# Patient Record
Sex: Male | Born: 1945 | Race: White | Hispanic: No | Marital: Married | Smoking: Never smoker
Health system: Southern US, Community
[De-identification: ages and names within clinical notes are randomized; demographics above are authoritative.]

## PROBLEM LIST (undated history)

## (undated) DIAGNOSIS — Z8719 Personal history of other diseases of the digestive system: Secondary | ICD-10-CM

## (undated) DIAGNOSIS — G709 Myoneural disorder, unspecified: Secondary | ICD-10-CM

## (undated) DIAGNOSIS — K219 Gastro-esophageal reflux disease without esophagitis: Secondary | ICD-10-CM

## (undated) DIAGNOSIS — M1990 Unspecified osteoarthritis, unspecified site: Secondary | ICD-10-CM

## (undated) DIAGNOSIS — I10 Essential (primary) hypertension: Secondary | ICD-10-CM

## (undated) HISTORY — PX: COLONOSCOPY: SHX174

---

## 2014-12-12 ENCOUNTER — Other Ambulatory Visit: Payer: Self-pay | Admitting: Nurse Practitioner

## 2014-12-12 ENCOUNTER — Ambulatory Visit
Admission: RE | Admit: 2014-12-12 | Discharge: 2014-12-12 | Disposition: A | Discharge status: Home / Self Care | Payer: Medicare Other | Source: Ambulatory Visit | Attending: Nurse Practitioner | Admitting: Nurse Practitioner

## 2014-12-12 DIAGNOSIS — R109 Unspecified abdominal pain: Secondary | ICD-10-CM

## 2014-12-29 ENCOUNTER — Other Ambulatory Visit: Payer: Self-pay | Admitting: Internal Medicine

## 2014-12-29 ENCOUNTER — Ambulatory Visit
Admission: RE | Admit: 2014-12-29 | Discharge: 2014-12-29 | Disposition: A | Discharge status: Home / Self Care | Payer: Medicare Other | Source: Ambulatory Visit | Attending: Internal Medicine | Admitting: Internal Medicine

## 2014-12-29 DIAGNOSIS — R042 Hemoptysis: Secondary | ICD-10-CM

## 2015-12-09 ENCOUNTER — Other Ambulatory Visit: Payer: Self-pay | Admitting: Internal Medicine

## 2015-12-09 ENCOUNTER — Ambulatory Visit
Admission: RE | Admit: 2015-12-09 | Discharge: 2015-12-09 | Disposition: A | Discharge status: Home / Self Care | Payer: Medicare Other | Source: Ambulatory Visit | Attending: Internal Medicine | Admitting: Internal Medicine

## 2015-12-09 DIAGNOSIS — R1011 Right upper quadrant pain: Secondary | ICD-10-CM | POA: Diagnosis not present

## 2015-12-09 DIAGNOSIS — R079 Chest pain, unspecified: Secondary | ICD-10-CM

## 2015-12-09 DIAGNOSIS — R05 Cough: Secondary | ICD-10-CM | POA: Diagnosis not present

## 2015-12-18 ENCOUNTER — Ambulatory Visit
Admission: RE | Admit: 2015-12-18 | Discharge: 2015-12-18 | Disposition: A | Discharge status: Home / Self Care | Payer: Medicare Other | Source: Ambulatory Visit | Attending: Internal Medicine | Admitting: Internal Medicine

## 2015-12-18 DIAGNOSIS — R1011 Right upper quadrant pain: Secondary | ICD-10-CM

## 2015-12-18 DIAGNOSIS — K7689 Other specified diseases of liver: Secondary | ICD-10-CM | POA: Diagnosis not present

## 2015-12-23 ENCOUNTER — Ambulatory Visit: Payer: Self-pay | Admitting: General Surgery

## 2015-12-23 DIAGNOSIS — K828 Other specified diseases of gallbladder: Secondary | ICD-10-CM | POA: Diagnosis not present

## 2015-12-23 NOTE — H&P (Signed)
History of Present Illness Harry Mckinney; 12/23/2015 9:37 AM) The patient is a 70 year old male who presents for evaluation of gall stones. The patient is a 70 year old male who is referred by Dr. 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 Patient states she's had pain in the right upper quadrant for approximately a month. He states that he has since changed his diet and is on a bland diet. Patient states that the pain is a right upper quadrant transverse sharp. There is no associated emesis however some nausea. Patient is an ultrasound which revealed a "porcelain gallbladder."      Other Problems Harry Mckinney, CMA; 12/23/2015 9:13 AM) Chest pain Gastroesophageal Reflux Disease High blood pressure  Past Surgical History Harry Mckinney, CMA; 12/23/2015 9:13 AM) No pertinent past surgical history  Diagnostic Studies History Harry Mckinney, CMA; 12/23/2015 9:13 AM) Colonoscopy 1-5 years ago  Allergies Harry Mckinney, CMA; 12/23/2015 9:13 AM) No Known Drug Allergies02/22/2017  Medication History Harry Mckinney, CMA; 12/23/2015 9:14 AM) AmLODIPine Besylate (  Tablet, Oral) Active. ALPRAZolam (  Tablet, Oral) Active. Lisinopril (  Tablet, Oral) Active. Medications Reconciled  Social History Harry Mckinney, [place]; 12/23/2015 9:13 AM) Alcohol use Occasional alcohol use. No caffeine use No drug use Tobacco use Never smoker.  Family History Harry Mckinney, [place]; 12/23/2015 9:13 AM) Diabetes Mellitus Father. Heart Disease Brother.    Review of Systems Harry Mckinney; 12/23/2015 9:35 AM) General Not Present- Appetite Loss, Chills, Fatigue, Fever, Night Sweats, Weight Gain and Weight Loss. Skin Not Present- Change in Wart/Mole, Dryness, Hives, Jaundice, New Lesions, Non-Healing Wounds, Rash and Ulcer. HEENT Not Present- Earache, Hearing Loss, Hoarseness, Nose Bleed, Oral Ulcers, Ringing in the Ears, Seasonal Allergies, Sinus Pain, Sore Throat, Visual  Disturbances, Wears glasses/contact lenses and Yellow Eyes. Respiratory Present- Snoring. Not Present- Bloody sputum, Chronic Cough, Difficulty Breathing and Wheezing. Cardiovascular Present- Chest Pain. Not Present- Difficulty Breathing Lying Down, Leg Cramps, Palpitations, Rapid Heart Rate, Shortness of Breath and Swelling of Extremities. Gastrointestinal Present- Abdominal Pain and Excessive gas. Not Present- Bloating, Bloody Stool, Change in Bowel Habits, Chronic diarrhea, Constipation, Difficulty Swallowing, Gets full quickly at meals, Hemorrhoids, Indigestion, Nausea, Rectal Pain and Vomiting. Male Genitourinary Not Present- Frequency, Nocturia, Painful Urination, Pelvic Pain and Urgency. Musculoskeletal Not Present- Back Pain, Joint Pain, Joint Stiffness, Muscle Pain, Muscle Weakness and Swelling of Extremities. Neurological Not Present- Weakness. Psychiatric Not Present- Anxiety, Bipolar, Change in Sleep Pattern, Depression, Fearful and Frequent crying. Endocrine Not Present- Cold Intolerance, Excessive Hunger, Hair Changes, Heat Intolerance, Hot flashes and New Diabetes.  Vitals Harry Mckinney CMA; 12/23/2015 9:14 AM) 12/23/2015 9:14 AM Weight: 197.2 lb Height: 71in Body Surface Area: 2.1 m Body Mass Index: 27.5 kg/m  Temp.: 97.58F(Temporal)  Pulse: 75 (Regular)  BP: 148/84 (Sitting, Left Arm, Standard)       Physical Exam Harry Filler, Mckinney; 12/23/2015 9:35 AM) General Mental Status-Alert. General Appearance-Consistent with stated age. Hydration-Well hydrated. Voice-Normal.  Head and Neck Head-normocephalic, atraumatic with no lesions or palpable masses.  Eye Eyeball - Bilateral-Extraocular movements intact. Sclera/Conjunctiva - Bilateral-No scleral icterus.  Chest and Lung Exam Chest and lung exam reveals -quiet, even and easy respiratory effort with no use of accessory muscles. Inspection Chest Wall - Normal. Back -  normal.  Cardiovascular Cardiovascular examination reveals -normal heart sounds, regular rate and rhythm with no murmurs.  Abdomen Inspection Normal Exam - No Hernias. Palpation/Percussion Normal exam - Soft, Non Tender, No Rebound tenderness, No Rigidity (guarding) and No hepatosplenomegaly. Auscultation Normal exam - Bowel sounds  normal.  Neurologic Neurologic evaluation reveals -alert and oriented x 3 with no impairment of recent or remote memory. Mental Status-Normal.  Musculoskeletal Normal Exam - Left-Upper Extremity Strength Normal and Lower Extremity Strength Normal. Normal Exam - Right-Upper Extremity Strength Normal, Lower Extremity Weakness.    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 Patient also appears to have symptoms from this.  1. We will proceed to the operating room for a laparoscopic cholecystectomy 2. Risks and benefits were discussed with the patient to generally include, but not limited to: infection, bleeding, possible need for post op ERCP, damage to the bile ducts, bile leak, and possible need for further surgery. Alternatives were offered and described. All questions were answered and the patient voiced understanding of the procedure and wishes to proceed at this point with a laparoscopic cholecystectomy

## 2015-12-30 ENCOUNTER — Other Ambulatory Visit (HOSPITAL_COMMUNITY): Payer: Self-pay | Admitting: *Deleted

## 2015-12-31 ENCOUNTER — Encounter (HOSPITAL_COMMUNITY): Payer: Self-pay

## 2015-12-31 ENCOUNTER — Encounter (HOSPITAL_COMMUNITY)
Admission: RE | Admit: 2015-12-31 | Discharge: 2015-12-31 | Disposition: A | Discharge status: Home / Self Care | Payer: Medicare Other | Source: Ambulatory Visit | Attending: General Surgery | Admitting: General Surgery

## 2015-12-31 DIAGNOSIS — K828 Other specified diseases of gallbladder: Secondary | ICD-10-CM | POA: Insufficient documentation

## 2015-12-31 DIAGNOSIS — Z01812 Encounter for preprocedural laboratory examination: Secondary | ICD-10-CM | POA: Insufficient documentation

## 2015-12-31 HISTORY — DX: Myoneural disorder, unspecified: G70.9

## 2015-12-31 HISTORY — DX: Unspecified osteoarthritis, unspecified site: M19.90

## 2015-12-31 HISTORY — DX: Personal history of other diseases of the digestive system: Z87.19

## 2015-12-31 HISTORY — DX: Essential (primary) hypertension: I10

## 2015-12-31 HISTORY — DX: Gastro-esophageal reflux disease without esophagitis: K21.9

## 2015-12-31 LAB — CBC
HEMATOCRIT: 44.5 % (ref 39.0–52.0)
Hemoglobin: 15.4 g/dL (ref 13.0–17.0)
MCH: 31.9 pg (ref 26.0–34.0)
MCHC: 34.6 g/dL (ref 30.0–36.0)
MCV: 92.1 fL (ref 78.0–100.0)
PLATELETS: DECREASED 10*3/uL (ref 150–400)
RBC: 4.83 MIL/uL (ref 4.22–5.81)
RDW: 11.6 % (ref 11.5–15.5)
WBC: 5 10*3/uL (ref 4.0–10.5)

## 2015-12-31 LAB — BASIC METABOLIC PANEL
Anion gap: 9 (ref 5–15)
BUN: 17 mg/dL (ref 6–20)
CO2: 27 mmol/L (ref 22–32)
CREATININE: 1.02 mg/dL (ref 0.61–1.24)
Calcium: 9.1 mg/dL (ref 8.9–10.3)
Chloride: 106 mmol/L (ref 101–111)
Glucose, Bld: 100 mg/dL — ABNORMAL HIGH (ref 65–99)
POTASSIUM: 4.4 mmol/L (ref 3.5–5.1)
SODIUM: 142 mmol/L (ref 135–145)

## 2015-12-31 NOTE — Progress Notes (Signed)
Call to [facility], challenge given for PCN allergy, in view of Cefazolin ordered for preop antibiotic. Spoke with [employer].  Also sent fax for records fr. Dr. Roseanne Reno.  Pt. Denies ever having any cardiac advancded testing.

## 2015-12-31 NOTE — Progress Notes (Signed)
Call back from [facility], Bernie, antibiotic will be changed to Cipro.

## 2015-12-31 NOTE — Pre-Procedure Instructions (Signed)
Harry Mckinney  12/31/2015      [id number] - [place], [place] - [address] [place] [address] Phone: [phone] Fax: [phone]    Your procedure is scheduled on 01/12/2016.  Report to [facility] Admitting at 7:00 A.M.  Call this number if you have problems the morning of surgery:  [phone]   Remember:  Do not eat food or drink liquids after midnight.  On Monday   Take these medicines the morning of surgery with A SIP OF WATER : amlodipine, ( tylenol, Zantac is  Ok if needed)   Do not wear jewelry   Do not wear lotions, powders, or perfumes.  You may wear deodorant.              Men may shave face and neck.   Do not bring valuables to the hospital.   [facility] is not responsible for any belongings or valuables.  Contacts, dentures or bridgework may not be worn into surgery.  Leave your suitcase in the car.  After surgery it may be brought to your room.  For patients admitted to the hospital, discharge time will be determined by your treatment team.  Patients discharged the day of surgery will not be allowed to drive home.   Name and phone number of your driver:   With spouse   Special instructions:  Special Instructions:  - Preparing for Surgery  Before surgery, you can play an important role.  Because skin is not sterile, your skin needs to be as free of germs as possible.  You can reduce the number of germs on you skin by washing with CHG (chlorahexidine gluconate) soap before surgery.  CHG is an antiseptic cleaner which kills germs and bonds with the skin to continue killing germs even after washing.  Please DO NOT use if you have an allergy to CHG or antibacterial soaps.  If your skin becomes reddened/irritated stop using the CHG and inform your nurse when you arrive at Short Stay.  Do not shave (including legs and underarms) for at least 48 hours prior to the first CHG shower.  You  may shave your face.  Please follow these instructions carefully:   1.  Shower with CHG Soap the night before surgery and the  morning of Surgery.  2.  If you choose to wash your hair, wash your hair first as usual with your  normal shampoo.  3.  After you shampoo, rinse your hair and body thoroughly to remove the  Shampoo.  4.  Use CHG as you would any other liquid soap.  You can apply chg directly to the skin and wash gently with scrungie or a clean washcloth.  5.  Apply the CHG Soap to your body ONLY FROM THE NECK DOWN.    Do not use on open wounds or open sores.  Avoid contact with your eyes, ears, mouth and genitals (private parts).  Wash genitals (private parts)   with your normal soap.  6.  Wash thoroughly, paying special attention to the area where your surgery will be performed.  7.  Thoroughly rinse your body with warm water from the neck down.  8.  DO NOT shower/wash with your normal soap after using and rinsing off   the CHG Soap.  9.  Pat yourself dry with a clean towel.            10.  Wear  clean pajamas.            11.  Place clean sheets on your bed the night of your first shower and do not sleep with pets.  Day of Surgery  Do not apply any lotions/deodorants the morning of surgery.  Please wear clean clothes to the hospital/surgery center.   Please read over the following fact sheets that you were given. Pain Booklet, Coughing and Deep Breathing and Surgical Site Infection Prevention

## 2015-12-31 NOTE — Progress Notes (Signed)
Pt. Unsure of name of antibiotic, but several yrs. Ago, he had extreme GI upset with an antibiotic that he was rec'ing for sinus problem. Pt. States it was Rx through [facility] & he was urged to call the office & explore what the antibiotic is to add to his list. )Pt. Counseled on how probiotic works & how it might be helpful to him.

## 2016-01-12 ENCOUNTER — Encounter (HOSPITAL_COMMUNITY): Payer: Self-pay | Admitting: *Deleted

## 2016-01-12 ENCOUNTER — Ambulatory Visit (HOSPITAL_COMMUNITY): Payer: Medicare Other | Admitting: Anesthesiology

## 2016-01-12 ENCOUNTER — Encounter (HOSPITAL_COMMUNITY)
Admission: RE | Disposition: A | Discharge status: Home / Self Care | Payer: Self-pay | Source: Ambulatory Visit | Attending: General Surgery

## 2016-01-12 ENCOUNTER — Ambulatory Visit (HOSPITAL_COMMUNITY)
Admission: RE | Admit: 2016-01-12 | Discharge: 2016-01-12 | Disposition: A | Discharge status: Home / Self Care | Payer: Medicare Other | Source: Ambulatory Visit | Attending: General Surgery | Admitting: General Surgery

## 2016-01-12 DIAGNOSIS — K219 Gastro-esophageal reflux disease without esophagitis: Secondary | ICD-10-CM | POA: Diagnosis not present

## 2016-01-12 DIAGNOSIS — K8020 Calculus of gallbladder without cholecystitis without obstruction: Secondary | ICD-10-CM | POA: Diagnosis not present

## 2016-01-12 DIAGNOSIS — K811 Chronic cholecystitis: Secondary | ICD-10-CM | POA: Insufficient documentation

## 2016-01-12 DIAGNOSIS — I10 Essential (primary) hypertension: Secondary | ICD-10-CM | POA: Insufficient documentation

## 2016-01-12 DIAGNOSIS — M1990 Unspecified osteoarthritis, unspecified site: Secondary | ICD-10-CM | POA: Diagnosis not present

## 2016-01-12 HISTORY — PX: CHOLECYSTECTOMY: SHX55

## 2016-01-12 SURGERY — LAPAROSCOPIC CHOLECYSTECTOMY
Anesthesia: General | Site: Abdomen

## 2016-01-12 MED ORDER — FENTANYL CITRATE (PF) 100 MCG/2ML IJ SOLN
25.0000 ug | INTRAMUSCULAR | Status: DC | PRN
  Administered 2016-01-12 (×2): 25 ug via INTRAVENOUS

## 2016-01-12 MED ORDER — OXYCODONE-ACETAMINOPHEN 5-325 MG PO TABS: 1.0000 | ORAL_TABLET | ORAL | Status: AC | PRN

## 2016-01-12 MED ORDER — EPHEDRINE SULFATE 50 MG/ML IJ SOLN
INTRAMUSCULAR | Status: DC | PRN
  Administered 2016-01-12: 10 mg via INTRAVENOUS
  Administered 2016-01-12: 5 mg via INTRAVENOUS

## 2016-01-12 MED ORDER — FENTANYL CITRATE (PF) 100 MCG/2ML IJ SOLN
INTRAMUSCULAR | Status: DC | PRN
  Administered 2016-01-12: 50 ug via INTRAVENOUS
  Administered 2016-01-12 (×2): 100 ug via INTRAVENOUS

## 2016-01-12 MED ORDER — FENTANYL CITRATE (PF) 250 MCG/5ML IJ SOLN
INTRAMUSCULAR | Status: AC
  Filled 2016-01-12: qty 5

## 2016-01-12 MED ORDER — LIDOCAINE HCL (CARDIAC) 20 MG/ML IV SOLN
INTRAVENOUS | Status: DC | PRN
  Administered 2016-01-12: 100 mg via INTRAVENOUS

## 2016-01-12 MED ORDER — PHENYLEPHRINE 40 MCG/ML (10ML) SYRINGE FOR IV PUSH (FOR BLOOD PRESSURE SUPPORT)
PREFILLED_SYRINGE | INTRAVENOUS | Status: AC
  Filled 2016-01-12: qty 10

## 2016-01-12 MED ORDER — 0.9 % SODIUM CHLORIDE (POUR BTL) OPTIME
TOPICAL | Status: DC | PRN
  Administered 2016-01-12: 1000 mL

## 2016-01-12 MED ORDER — SODIUM CHLORIDE 0.9 % IJ SOLN
INTRAMUSCULAR | Status: AC
  Filled 2016-01-12: qty 10

## 2016-01-12 MED ORDER — SUGAMMADEX SODIUM 200 MG/2ML IV SOLN
INTRAVENOUS | Status: AC
  Filled 2016-01-12: qty 2

## 2016-01-12 MED ORDER — ONDANSETRON HCL 4 MG/2ML IJ SOLN
INTRAMUSCULAR | Status: DC | PRN
Start: 2016-01-12 — End: 2016-01-12
  Administered 2016-01-12: 4 mg via INTRAVENOUS

## 2016-01-12 MED ORDER — EPHEDRINE SULFATE 50 MG/ML IJ SOLN
INTRAMUSCULAR | Status: AC
  Filled 2016-01-12: qty 1

## 2016-01-12 MED ORDER — CHLORHEXIDINE GLUCONATE 4 % EX LIQD: 1.0000 "application " | Freq: Once | CUTANEOUS | Status: DC

## 2016-01-12 MED ORDER — SODIUM CHLORIDE 0.9 % IR SOLN
Status: DC | PRN
  Administered 2016-01-12: 1000 mL

## 2016-01-12 MED ORDER — LIDOCAINE HCL (CARDIAC) 20 MG/ML IV SOLN
INTRAVENOUS | Status: AC
  Filled 2016-01-12: qty 5

## 2016-01-12 MED ORDER — PROMETHAZINE HCL 25 MG/ML IJ SOLN: 6.2500 mg | INTRAMUSCULAR | Status: DC | PRN

## 2016-01-12 MED ORDER — ONDANSETRON HCL 4 MG/2ML IJ SOLN
INTRAMUSCULAR | Status: AC
  Filled 2016-01-12: qty 2

## 2016-01-12 MED ORDER — CIPROFLOXACIN IN D5W 400 MG/200ML IV SOLN
400.0000 mg | INTRAVENOUS | Status: AC
  Administered 2016-01-12: 400 mg via INTRAVENOUS
  Filled 2016-01-12: qty 200

## 2016-01-12 MED ORDER — MIDAZOLAM HCL 2 MG/2ML IJ SOLN
INTRAMUSCULAR | Status: AC
  Filled 2016-01-12: qty 2

## 2016-01-12 MED ORDER — ROCURONIUM BROMIDE 100 MG/10ML IV SOLN
INTRAVENOUS | Status: DC | PRN
Start: 2016-01-12 — End: 2016-01-12
  Administered 2016-01-12: 40 mg via INTRAVENOUS

## 2016-01-12 MED ORDER — FENTANYL CITRATE (PF) 100 MCG/2ML IJ SOLN
INTRAMUSCULAR | Status: AC
  Filled 2016-01-12: qty 2

## 2016-01-12 MED ORDER — GLYCOPYRROLATE 0.2 MG/ML IJ SOLN
INTRAMUSCULAR | Status: AC
  Filled 2016-01-12: qty 1

## 2016-01-12 MED ORDER — LACTATED RINGERS IV SOLN
INTRAVENOUS | Status: DC
  Administered 2016-01-12 (×2): via INTRAVENOUS

## 2016-01-12 MED ORDER — BUPIVACAINE HCL 0.25 % IJ SOLN
INTRAMUSCULAR | Status: DC | PRN
  Administered 2016-01-12: 3 mL

## 2016-01-12 MED ORDER — SUGAMMADEX SODIUM 200 MG/2ML IV SOLN
INTRAVENOUS | Status: DC | PRN
  Administered 2016-01-12: 180 mg via INTRAVENOUS

## 2016-01-12 MED ORDER — MIDAZOLAM HCL 5 MG/5ML IJ SOLN
INTRAMUSCULAR | Status: DC | PRN
  Administered 2016-01-12 (×2): 1 mg via INTRAVENOUS

## 2016-01-12 MED ORDER — PROPOFOL 10 MG/ML IV BOLUS
INTRAVENOUS | Status: AC
  Filled 2016-01-12: qty 20

## 2016-01-12 MED ORDER — BUPIVACAINE HCL (PF) 0.25 % IJ SOLN
INTRAMUSCULAR | Status: AC
Start: 2016-01-12 — End: 2016-01-12
  Filled 2016-01-12: qty 30

## 2016-01-12 MED ORDER — ROCURONIUM BROMIDE 50 MG/5ML IV SOLN
INTRAVENOUS | Status: AC
  Filled 2016-01-12: qty 2

## 2016-01-12 MED ORDER — PROPOFOL 10 MG/ML IV BOLUS
INTRAVENOUS | Status: DC | PRN
  Administered 2016-01-12: 150 mg via INTRAVENOUS

## 2016-01-12 SURGICAL SUPPLY — 42 items
BENZOIN TINCTURE PRP APPL 2/3 (GAUZE/BANDAGES/DRESSINGS) ×2 IMPLANT
CANISTER SUCTION 2500CC (MISCELLANEOUS) ×2 IMPLANT
CHLORAPREP W/TINT 26ML (MISCELLANEOUS) ×2 IMPLANT
CLIP LIGATING HEMO O LOK GREEN (MISCELLANEOUS) ×2 IMPLANT
CLSR STERI-STRIP ANTIMIC 1/2X4 (GAUZE/BANDAGES/DRESSINGS) ×2 IMPLANT
COVER SURGICAL LIGHT HANDLE (MISCELLANEOUS) ×2 IMPLANT
COVER TRANSDUCER ULTRASND (DRAPES) ×2 IMPLANT
DEVICE TROCAR PUNCTURE CLOSURE (ENDOMECHANICALS) ×2 IMPLANT
ELECT REM PT RETURN 9FT ADLT (ELECTROSURGICAL) ×2
ELECTRODE REM PT RTRN 9FT ADLT (ELECTROSURGICAL) ×1 IMPLANT
GAUZE SPONGE 2X2 8PLY STRL LF (GAUZE/BANDAGES/DRESSINGS) ×1 IMPLANT
GLOVE BIO SURGEON STRL SZ7 (GLOVE) ×2 IMPLANT
GLOVE BIO SURGEON STRL SZ7.5 (GLOVE) ×2 IMPLANT
GLOVE BIOGEL PI IND STRL 6 (GLOVE) ×1 IMPLANT
GLOVE BIOGEL PI IND STRL 7.0 (GLOVE) ×2 IMPLANT
GLOVE BIOGEL PI INDICATOR 6 (GLOVE) ×1
GLOVE BIOGEL PI INDICATOR 7.0 (GLOVE) ×2
GLOVE SURG SS PI 6.5 STRL IVOR (GLOVE) ×2 IMPLANT
GOWN STRL REUS W/ TWL LRG LVL3 (GOWN DISPOSABLE) ×2 IMPLANT
GOWN STRL REUS W/ TWL XL LVL3 (GOWN DISPOSABLE) ×1 IMPLANT
GOWN STRL REUS W/TWL LRG LVL3 (GOWN DISPOSABLE) ×2
GOWN STRL REUS W/TWL XL LVL3 (GOWN DISPOSABLE) ×1
KIT BASIN OR (CUSTOM PROCEDURE TRAY) ×2 IMPLANT
KIT ROOM TURNOVER OR (KITS) ×2 IMPLANT
NEEDLE INSUFFLATION 14GA 120MM (NEEDLE) ×2 IMPLANT
NS IRRIG 1000ML POUR BTL (IV SOLUTION) ×2 IMPLANT
PAD ARMBOARD 7.5X6 YLW CONV (MISCELLANEOUS) ×4 IMPLANT
POUCH RETRIEVAL ECOSAC 10 (ENDOMECHANICALS) ×1 IMPLANT
POUCH RETRIEVAL ECOSAC 10MM (ENDOMECHANICALS) ×1
SCISSORS LAP 5X35 DISP (ENDOMECHANICALS) ×2 IMPLANT
SET IRRIG TUBING LAPAROSCOPIC (IRRIGATION / IRRIGATOR) ×2 IMPLANT
SLEEVE ENDOPATH XCEL 5M (ENDOMECHANICALS) ×4 IMPLANT
SPECIMEN JAR SMALL (MISCELLANEOUS) ×2 IMPLANT
SPONGE GAUZE 2X2 STER 10/PKG (GAUZE/BANDAGES/DRESSINGS) ×1
SUT MNCRL AB 3-0 PS2 18 (SUTURE) ×2 IMPLANT
SUT MNCRL AB 4-0 PS2 18 (SUTURE) ×2 IMPLANT
TOWEL OR 17X24 6PK STRL BLUE (TOWEL DISPOSABLE) ×2 IMPLANT
TOWEL OR 17X26 10 PK STRL BLUE (TOWEL DISPOSABLE) ×2 IMPLANT
TRAY LAPAROSCOPIC MC (CUSTOM PROCEDURE TRAY) ×2 IMPLANT
TROCAR XCEL NON-BLD 11X100MML (ENDOMECHANICALS) ×2 IMPLANT
TROCAR XCEL NON-BLD 5MMX100MML (ENDOMECHANICALS) ×2 IMPLANT
TUBING INSUFFLATION (TUBING) ×2 IMPLANT

## 2016-01-12 NOTE — Anesthesia Postprocedure Evaluation (Signed)
Anesthesia Post Note  Patient: Harry Mckinney  Procedure(s) Performed: Procedure(s) (LRB): LAPAROSCOPIC CHOLECYSTECTOMY (N/A)  Patient location during evaluation: PACU Anesthesia Type: General Level of consciousness: awake and alert Pain management: pain level controlled Vital Signs Assessment: post-procedure vital signs reviewed and stable Respiratory status: spontaneous breathing, nonlabored ventilation, respiratory function stable and patient connected to nasal cannula oxygen Cardiovascular status: blood pressure returned to baseline and stable Postop Assessment: no signs of nausea or vomiting Anesthetic complications: no    Last Vitals:  Filed Vitals:   01/12/16 1045 01/12/16 1059  BP:  138/95  Pulse:  63  Temp: 36.4 C   Resp:  16    Last Pain:  Filed Vitals:   01/12/16 1059  PainSc: 4                  Reino KentJudd, Marcelina Mclaurin J

## 2016-01-12 NOTE — H&P (View-Only) (Signed)
History of Present Illness Axel Filler(Jeny Nield MD; 12/23/2015 9:37 AM) The patient is a 70 year old male who presents for evaluation of gall stones. The patient is a 70 year old male who is referred by Dr. 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 Patient states she's had pain in the right upper quadrant for approximately a month. He states that he has since changed his diet and is on a bland diet. Patient states that the pain is a right upper quadrant transverse sharp. There is no associated emesis however some nausea. Patient is an ultrasound which revealed a "porcelain gallbladder."      Other Problems Fay Records(Ashley Beck, CMA; 12/23/2015 9:13 AM) Chest pain Gastroesophageal Reflux Disease High blood pressure  Past Surgical History Fay Records(Ashley Beck, CMA; 12/23/2015 9:13 AM) No pertinent past surgical history  Diagnostic Studies History Fay Records(Ashley Beck, CMA; 12/23/2015 9:13 AM) Colonoscopy 1-5 years ago  Allergies Fay Records(Ashley Beck, CMA; 12/23/2015 9:13 AM) No Known Drug Allergies02/22/2017  Medication History Fay Records(Ashley Beck, CMA; 12/23/2015 9:14 AM) AmLODIPine Besylate (5MG  Tablet, Oral) Active. ALPRAZolam (1MG  Tablet, Oral) Active. Lisinopril (40MG  Tablet, Oral) Active. Medications Reconciled  Social History Fay Records([place]; 12/23/2015 9:13 AM) Alcohol use Occasional alcohol use. No caffeine use No drug use Tobacco use Never smoker.  Family History Fay Records([place]; 12/23/2015 9:13 AM) Diabetes Mellitus Father. Heart Disease Brother.    Review of Systems Axel Filler(Haleem Hanner MD; 12/23/2015 9:35 AM) General Not Present- Appetite Loss, Chills, Fatigue, Fever, Night Sweats, Weight Gain and Weight Loss. Skin Not Present- Change in Wart/Mole, Dryness, Hives, Jaundice, New Lesions, Non-Healing Wounds, Rash and Ulcer. HEENT Not Present- Earache, Hearing Loss, Hoarseness, Nose Bleed, Oral Ulcers, Ringing in the Ears, Seasonal Allergies, Sinus Pain, Sore Throat, Visual  Disturbances, Wears glasses/contact lenses and Yellow Eyes. Respiratory Present- Snoring. Not Present- Bloody sputum, Chronic Cough, Difficulty Breathing and Wheezing. Cardiovascular Present- Chest Pain. Not Present- Difficulty Breathing Lying Down, Leg Cramps, Palpitations, Rapid Heart Rate, Shortness of Breath and Swelling of Extremities. Gastrointestinal Present- Abdominal Pain and Excessive gas. Not Present- Bloating, Bloody Stool, Change in Bowel Habits, Chronic diarrhea, Constipation, Difficulty Swallowing, Gets full quickly at meals, Hemorrhoids, Indigestion, Nausea, Rectal Pain and Vomiting. Male Genitourinary Not Present- Frequency, Nocturia, Painful Urination, Pelvic Pain and Urgency. Musculoskeletal Not Present- Back Pain, Joint Pain, Joint Stiffness, Muscle Pain, Muscle Weakness and Swelling of Extremities. Neurological Not Present- Weakness. Psychiatric Not Present- Anxiety, Bipolar, Change in Sleep Pattern, Depression, Fearful and Frequent crying. Endocrine Not Present- Cold Intolerance, Excessive Hunger, Hair Changes, Heat Intolerance, Hot flashes and New Diabetes.  Vitals Fay Records(Ashley Beck CMA; 12/23/2015 9:14 AM) 12/23/2015 9:14 AM Weight: 197.2 lb Height: 71in Body Surface Area: 2.1 m Body Mass Index: 27.5 kg/m  Temp.: 97.58F(Temporal)  Pulse: 75 (Regular)  BP: 148/84 (Sitting, Left Arm, Standard)       Physical Exam Axel Filler(Damiean Lukes, MD; 12/23/2015 9:35 AM) General Mental Status-Alert. General Appearance-Consistent with stated age. Hydration-Well hydrated. Voice-Normal.  Head and Neck Head-normocephalic, atraumatic with no lesions or palpable masses.  Eye Eyeball - Bilateral-Extraocular movements intact. Sclera/Conjunctiva - Bilateral-No scleral icterus.  Chest and Lung Exam Chest and lung exam reveals -quiet, even and easy respiratory effort with no use of accessory muscles. Inspection Chest Wall - Normal. Back -  normal.  Cardiovascular Cardiovascular examination reveals -normal heart sounds, regular rate and rhythm with no murmurs.  Abdomen Inspection Normal Exam - No Hernias. Palpation/Percussion Normal exam - Soft, Non Tender, No Rebound tenderness, No Rigidity (guarding) and No hepatosplenomegaly. Auscultation Normal exam - Bowel sounds  normal.  Neurologic Neurologic evaluation reveals -alert and oriented x 3 with no impairment of recent or remote memory. Mental Status-Normal.  Musculoskeletal Normal Exam - Left-Upper Extremity Strength Normal and Lower Extremity Strength Normal. Normal Exam - Right-Upper Extremity Strength Normal, Lower Extremity Weakness.    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 Patient also appears to have symptoms from this.  1. We will proceed to the operating room for a laparoscopic cholecystectomy 2. Risks and benefits were discussed with the patient to generally include, but not limited to: infection, bleeding, possible need for post op ERCP, damage to the bile ducts, bile leak, and possible need for further surgery. Alternatives were offered and described. All questions were answered and the patient voiced understanding of the procedure and wishes to proceed at this point with a laparoscopic cholecystectomy

## 2016-01-12 NOTE — Transfer of Care (Signed)
Immediate Anesthesia Transfer of Care Note  Patient: Harry Mckinney  Procedure(s) Performed: Procedure(s): LAPAROSCOPIC CHOLECYSTECTOMY (N/A)  Patient Location: PACU   Anesthesia Type:General  Level of Consciousness: awake, alert , oriented and patient cooperative  Airway & Oxygen Therapy: Patient Spontanous Breathing and Patient connected to nasal cannula oxygen  Post-op Assessment: Report given to RN, Post -op Vital signs reviewed and stable and Patient moving all extremities X 4  Post vital signs: Reviewed and stable  Last Vitals:  Filed Vitals:   01/12/16 0727  BP: 149/99  Pulse: 64  Temp: 36.9 C  Resp: 20    Complications: No apparent anesthesia complications

## 2016-01-12 NOTE — Anesthesia Preprocedure Evaluation (Signed)
Anesthesia Evaluation  Patient identified by MRN, date of birth, ID band Patient awake    Reviewed: Allergy & Precautions, H&P , NPO status , Patient's Chart, lab work & pertinent test results  History of Anesthesia Complications Negative for: history of anesthetic complications  Airway Mallampati: II  TM Distance: >3 FB Neck ROM: full    Dental no notable dental hx.    Pulmonary neg pulmonary ROS,    Pulmonary exam normal breath sounds clear to auscultation       Cardiovascular hypertension, Pt. on medications Normal cardiovascular exam Rhythm:regular Rate:Normal     Neuro/Psych  Neuromuscular disease    GI/Hepatic Neg liver ROS, hiatal hernia, GERD  ,  Endo/Other  negative endocrine ROS  Renal/GU negative Renal ROS     Musculoskeletal   Abdominal   Peds  Hematology negative hematology ROS (+)   Anesthesia Other Findings   Reproductive/Obstetrics negative OB ROS                             Anesthesia Physical Anesthesia Plan  ASA: II  Anesthesia Plan: General   Post-op Pain Management:    Induction: Intravenous  Airway Management Planned: Oral ETT  Additional Equipment:   Intra-op Plan:   Post-operative Plan: Extubation in OR  Informed Consent: I have reviewed the patients History and Physical, chart, labs and discussed the procedure including the risks, benefits and alternatives for the proposed anesthesia with the patient or authorized representative who has indicated his/her understanding and acceptance.   Dental Advisory Given  Plan Discussed with: Anesthesiologist, CRNA and Surgeon  Anesthesia Plan Comments:         Anesthesia Quick Evaluation

## 2016-01-12 NOTE — Anesthesia Procedure Notes (Signed)
Procedure Name: Intubation Date/Time: 01/12/2016 9:04 AM Performed by: Fabian NovemberSOLHEIM, Etan Vasudevan SALOMAN Pre-anesthesia Checklist: Patient identified, Patient being monitored, Timeout performed, Emergency Drugs available and Suction available Patient Re-evaluated:Patient Re-evaluated prior to inductionOxygen Delivery Method: Circle System Utilized Preoxygenation: Pre-oxygenation with 100% oxygen Intubation Type: IV induction Ventilation: Mask ventilation without difficulty Laryngoscope Size: Miller and 3 Grade View: Grade I Tube type: Oral Tube size: 7.5 mm Number of attempts: 1 Airway Equipment and Method: Stylet Placement Confirmation: ETT inserted through vocal cords under direct vision,  positive ETCO2 and breath sounds checked- equal and bilateral Secured at: 23 cm Tube secured with: Tape Dental Injury: Teeth and Oropharynx as per pre-operative assessment

## 2016-01-12 NOTE — Discharge Instructions (Signed)
[facility] ______[facility] °LAPAROSCOPIC SURGERY: POST OP INSTRUCTIONS °Always review your discharge instruction sheet given to you by the facility where your surgery was performed. °IF YOU HAVE DISABILITY OR FAMILY LEAVE FORMS, YOU MUST BRING THEM TO THE OFFICE FOR PROCESSING.   °DO NOT GIVE THEM TO YOUR DOCTOR. ° °1. A prescription for pain medication may be given to you upon discharge.  Take your pain medication as prescribed, if needed.  If narcotic pain medicine is not needed, then you may take acetaminophen (Tylenol) or ibuprofen (Advil) as needed. °2. Take your usually prescribed medications unless otherwise directed. °3. If you need a refill on your pain medication, please contact your pharmacy.  They will contact our office to request authorization. Prescriptions will not be filled after 5pm or on week-ends. °4. You should follow a light diet the first few days after arrival home, such as soup and crackers, etc.  Be sure to include lots of fluids daily. °5. Most patients will experience some swelling and bruising in the area of the incisions.  Ice packs will help.  Swelling and bruising can take several days to resolve.  °6. It is common to experience some constipation if taking pain medication after surgery.  Increasing fluid intake and taking a stool softener (such as Colace) will usually help or prevent this problem from occurring.  A mild laxative (Milk of Magnesia or Miralax) should be taken according to package instructions if there are no bowel movements after 48 hours. °7. Unless discharge instructions indicate otherwise, you may remove your bandages 24-48 hours after surgery, and you may shower at that time.  You may have steri-strips (small skin tapes) in place directly over the incision.  These strips should be left on the skin for 7-10 days.  If your surgeon used skin glue on the incision, you may shower in 24 hours.  The glue will flake off over the next 2-3 weeks.  Any sutures or  staples will be removed at the office during your follow-up visit. °8. ACTIVITIES:  You may resume regular (light) daily activities beginning the next day--such as daily self-care, walking, climbing stairs--gradually increasing activities as tolerated.  You may have sexual intercourse when it is comfortable.  Refrain from any heavy lifting or straining until approved by your doctor. °a. You may drive when you are no longer taking prescription pain medication, you can comfortably wear a seatbelt, and you can safely maneuver your car and apply brakes. °b. RETURN TO WORK:  __________________________________________________________ °9. You should see your doctor in the office for a follow-up appointment approximately 2-3 weeks after your surgery.  Make sure that you call for this appointment within a day or two after you arrive home to insure a convenient appointment time. °10. OTHER INSTRUCTIONS: __________________________________________________________________________________________________________________________ __________________________________________________________________________________________________________________________ °WHEN TO CALL YOUR DOCTOR: °1. Fever over 101.0 °2. Inability to urinate °3. Continued bleeding from incision. °4. Increased pain, redness, or drainage from the incision. °5. Increasing abdominal pain ° °The clinic staff is available to answer your questions during regular business hours.  Please don’t hesitate to call and ask to speak to one of the nurses for clinical concerns.  If you have a medical emergency, go to the nearest emergency room or call 911.  A surgeon from [facility] is always on call at the hospital. °[address], [address], [place], [place]  [address] ? [address] [address], [place], [place]   [address] °[phone] ? [phone] ? [phone] [phone] °Web site:   [email or web address] °

## 2016-01-12 NOTE — Op Note (Signed)
01/12/2016  9:53 AM  PATIENT:  Harry Mckinney  70 y.o. male  PRE-OPERATIVE DIAGNOSIS:  Calcified gallbladder  POST-OPERATIVE DIAGNOSIS:  Chronic cholecystitis  PROCEDURE:  Procedure(s): LAPAROSCOPIC CHOLECYSTECTOMY (N/A)  SURGEON:  Surgeon(s) and Role:    * Axel FillerArmando Timoth Schara, MD - Primary   ANESTHESIA:   local and general  EBL:  Total I/O In: 1000 [I.V.:1000] Out: 10 [Blood:10]  BLOOD ADMINISTERED:none  DRAINS: none   LOCAL MEDICATIONS USED:  BUPIVICAINE   SPECIMEN:  Source of Specimen:  gallbladder  DISPOSITION OF SPECIMEN:  PATHOLOGY  COUNTS:  YES  TOURNIQUET:  * No tourniquets in log *  DICTATION: .Dragon Dictation The patient was taken to the operating and placed in the supine position with bilateral SCDs in place. The patient was prepped and draped in the usual sterile fashion. A time out was called and all facts were verified. A pneumoperitoneum was obtained via A Veress needle technique to a pressure of 14mm of mercury.  A 5mm trochar was then placed in the right upper quadrant under visualization, and there were no injuries to any abdominal organs. A 11 mm port was then placed in the umbilical region after infiltrating with local anesthesia under direct visualization. A second and third epigastric port and right lower quadrant port placement under direct visualization, respectively.The gallbladder was identified and retracted, the peritoneum was then sharply dissected from the gallbladder and this dissection was carried down to Calot's triangle. The gallbladder was identified and stripped away circumferentially and seen going into the gallbladder 360, the critical angle was obtained.    clips were placed proximally one distally and the cystic duct transected. The cystic artery was identified and 2 clips placed proximally and one distally and transected. We then proceeded to remove the gallbladder off the hepatic fossa with Bovie cautery. A retrieval bag was then  placed in the abdomen and gallbladder placed in the bag. The hepatic fossa was then reexamined and hemostasis was achieved with Bovie cautery and was excellent at the end of the case. The subhepatic fossa and perihepatic fossa was then irrigated until the effluent was clear.  The gallbladder and bag were removed from the abdominal cavity.   The 11 mm trocar fascia was reapproximated with the Endo Close #1 Vicryl. The pneumoperitoneum was evacuated and all trochars removed under direct visulalization. The skin was then closed with 4-0 Monocryl and the skin dressed with Steri-Strips, gauze, and tape. The patient was awaken from general anesthesia and taken to the recovery room in stable condition.   PLAN OF CARE: Discharge to home after PACU  PATIENT DISPOSITION:  PACU - hemodynamically stable.   Delay start of Pharmacological VTE agent (>24hrs) due to surgical blood loss or risk of bleeding: not applicable

## 2016-01-12 NOTE — Interval H&P Note (Signed)
History and Physical Interval Note:  01/12/2016 8:26 AM  Harry Mckinney  has presented today for surgery, with the diagnosis of Calcified gallbladder  The various methods of treatment have been discussed with the patient and family. After consideration of risks, benefits and other options for treatment, the patient has consented to  Procedure(s): LAPAROSCOPIC CHOLECYSTECTOMY (N/A) as a surgical intervention .  The patient's history has been reviewed, patient examined, no change in status, stable for surgery.  I have reviewed the patient's chart and labs.  Questions were answered to the patient's satisfaction.     Marigene Ehlersamirez Jr., Jed LimerickArmando

## 2016-01-13 ENCOUNTER — Encounter (HOSPITAL_COMMUNITY): Payer: Self-pay | Admitting: General Surgery

## 2016-02-15 DIAGNOSIS — I10 Essential (primary) hypertension: Secondary | ICD-10-CM | POA: Diagnosis not present

## 2016-03-10 IMAGING — CR DG CHEST 2V
2 series · 2 of 2 positions shown · non-contrast
Comparison: None.

CLINICAL DATA: Shortness of breath and chest pain with hemoptysis

EXAM:
CHEST  2 VIEW

[view not recorded (1 of 2)]
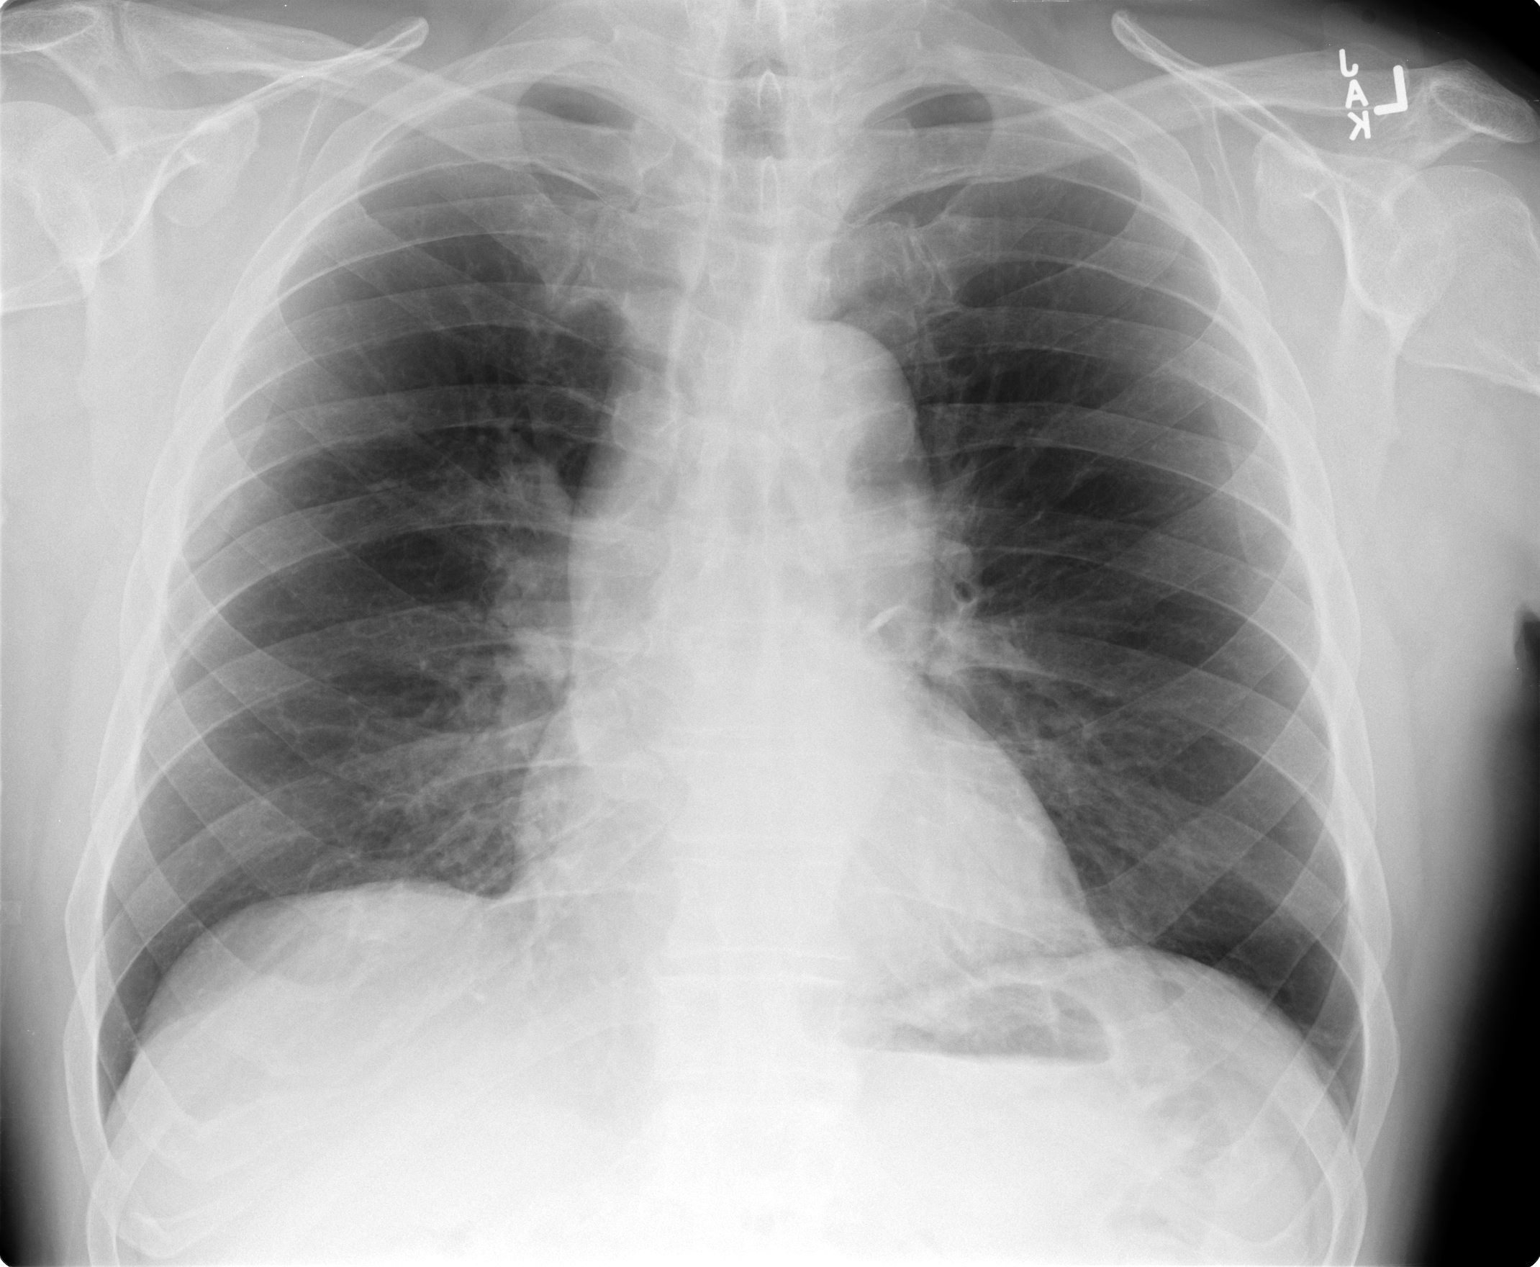

[view not recorded (2 of 2)]
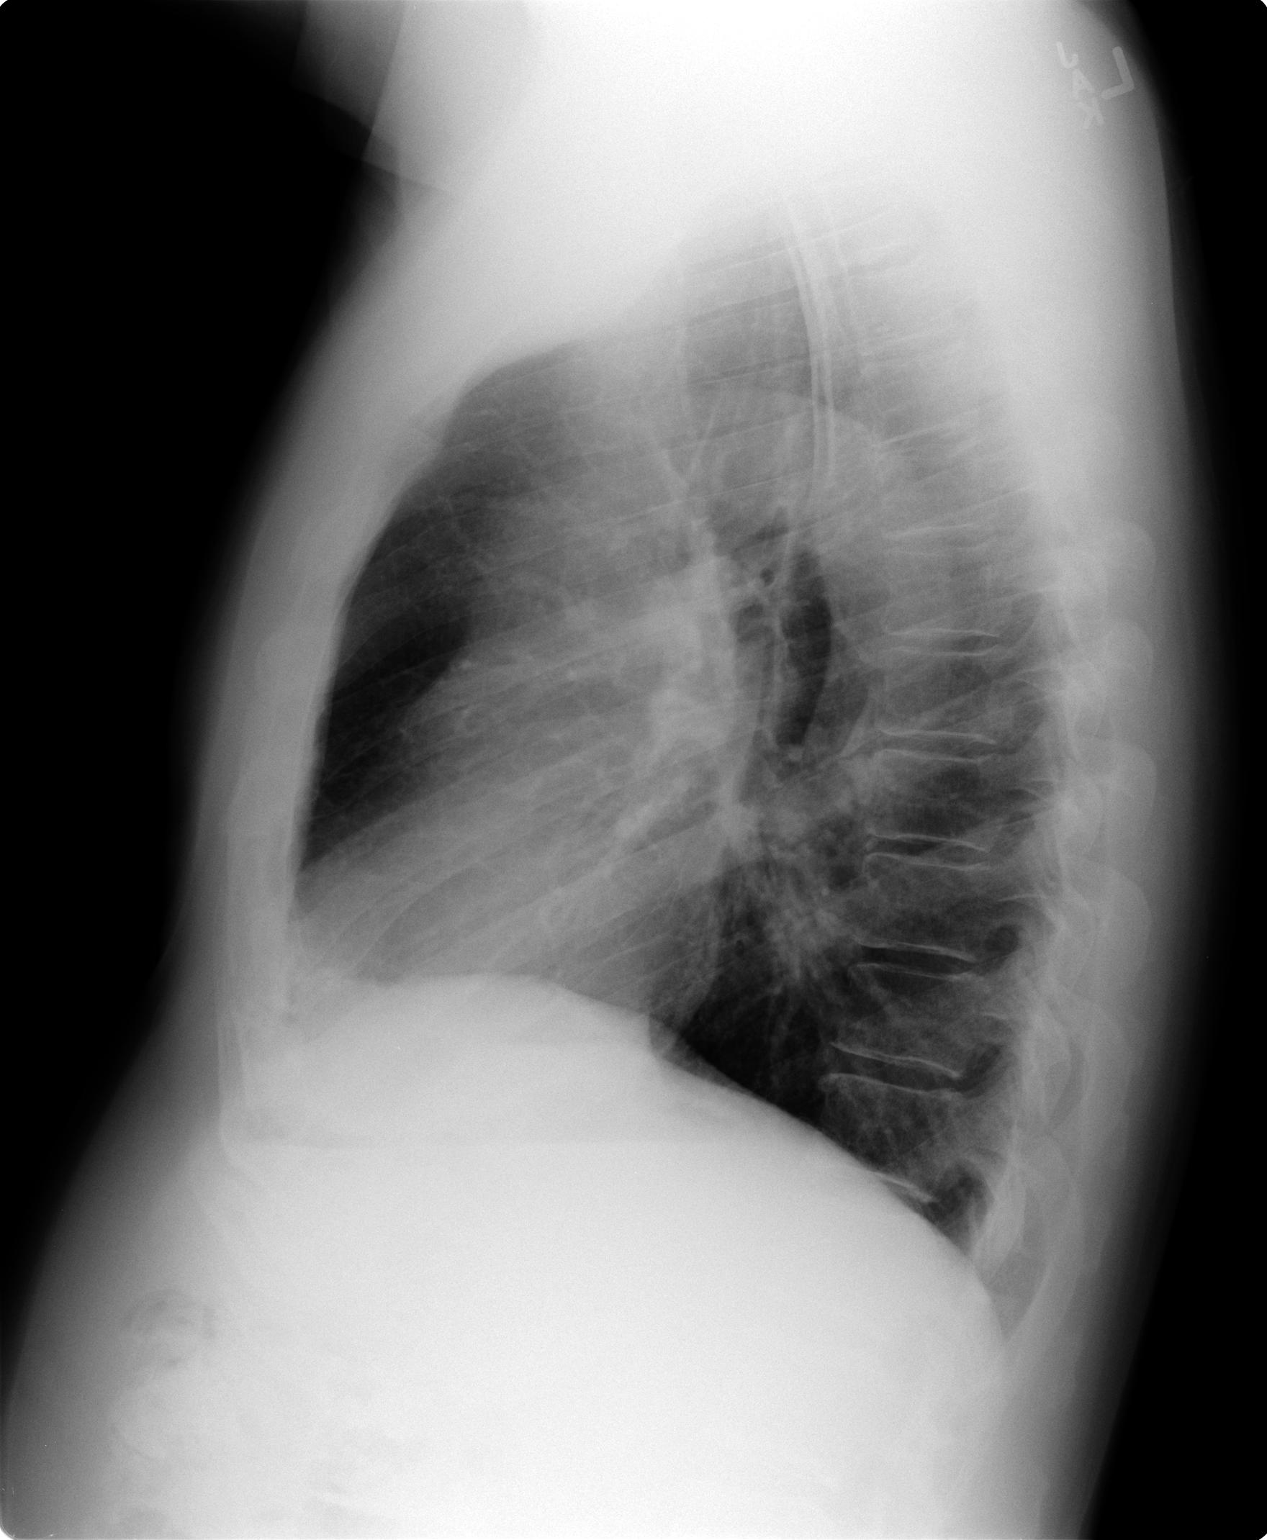

[2 of 2 positions shown; findings below may reference images not displayed]

FINDINGS: Cardiac shadow is within normal limits. The thoracic aorta is mildly
tortuous although non aneurysmal. The lungs are well aerated
bilaterally without focal infiltrate or sizable effusion. No acute
bony abnormality is noted.
IMPRESSION: No active cardiopulmonary disease.

## 2016-07-14 DIAGNOSIS — L570 Actinic keratosis: Secondary | ICD-10-CM | POA: Diagnosis not present

## 2016-07-14 DIAGNOSIS — Z872 Personal history of diseases of the skin and subcutaneous tissue: Secondary | ICD-10-CM | POA: Diagnosis not present

## 2016-07-14 DIAGNOSIS — L821 Other seborrheic keratosis: Secondary | ICD-10-CM | POA: Diagnosis not present

## 2016-08-17 DIAGNOSIS — Z0000 Encounter for general adult medical examination without abnormal findings: Secondary | ICD-10-CM | POA: Diagnosis not present

## 2016-08-17 DIAGNOSIS — I10 Essential (primary) hypertension: Secondary | ICD-10-CM | POA: Diagnosis not present

## 2016-08-17 DIAGNOSIS — Z1389 Encounter for screening for other disorder: Secondary | ICD-10-CM | POA: Diagnosis not present

## 2016-08-17 DIAGNOSIS — J302 Other seasonal allergic rhinitis: Secondary | ICD-10-CM | POA: Diagnosis not present

## 2016-08-17 DIAGNOSIS — H6121 Impacted cerumen, right ear: Secondary | ICD-10-CM | POA: Diagnosis not present

## 2016-08-18 DIAGNOSIS — H6121 Impacted cerumen, right ear: Secondary | ICD-10-CM | POA: Diagnosis not present

## 2016-09-05 DIAGNOSIS — M25511 Pain in right shoulder: Secondary | ICD-10-CM | POA: Diagnosis not present

## 2016-09-15 DIAGNOSIS — M25511 Pain in right shoulder: Secondary | ICD-10-CM | POA: Diagnosis not present

## 2016-09-20 DIAGNOSIS — M75111 Incomplete rotator cuff tear or rupture of right shoulder, not specified as traumatic: Secondary | ICD-10-CM | POA: Diagnosis not present

## 2016-09-20 DIAGNOSIS — M67911 Unspecified disorder of synovium and tendon, right shoulder: Secondary | ICD-10-CM | POA: Diagnosis not present

## 2016-10-03 DIAGNOSIS — R1011 Right upper quadrant pain: Secondary | ICD-10-CM | POA: Diagnosis not present

## 2016-10-03 DIAGNOSIS — R5382 Chronic fatigue, unspecified: Secondary | ICD-10-CM | POA: Diagnosis not present

## 2016-10-12 DIAGNOSIS — L570 Actinic keratosis: Secondary | ICD-10-CM | POA: Diagnosis not present

## 2016-11-09 DIAGNOSIS — R5382 Chronic fatigue, unspecified: Secondary | ICD-10-CM | POA: Diagnosis not present

## 2016-11-21 DIAGNOSIS — H524 Presbyopia: Secondary | ICD-10-CM | POA: Diagnosis not present

## 2017-02-13 DIAGNOSIS — L570 Actinic keratosis: Secondary | ICD-10-CM | POA: Diagnosis not present

## 2017-02-13 DIAGNOSIS — L821 Other seborrheic keratosis: Secondary | ICD-10-CM | POA: Diagnosis not present

## 2017-02-16 DIAGNOSIS — I10 Essential (primary) hypertension: Secondary | ICD-10-CM | POA: Diagnosis not present

## 2017-02-18 IMAGING — CR DG CHEST 2V
2 series · 2 of 2 positions shown · non-contrast
Comparison: 12/29/2014

CLINICAL DATA: Cough, chest pain for 3-4 weeks

EXAM:
CHEST  2 VIEW

[view not recorded (1 of 2)]
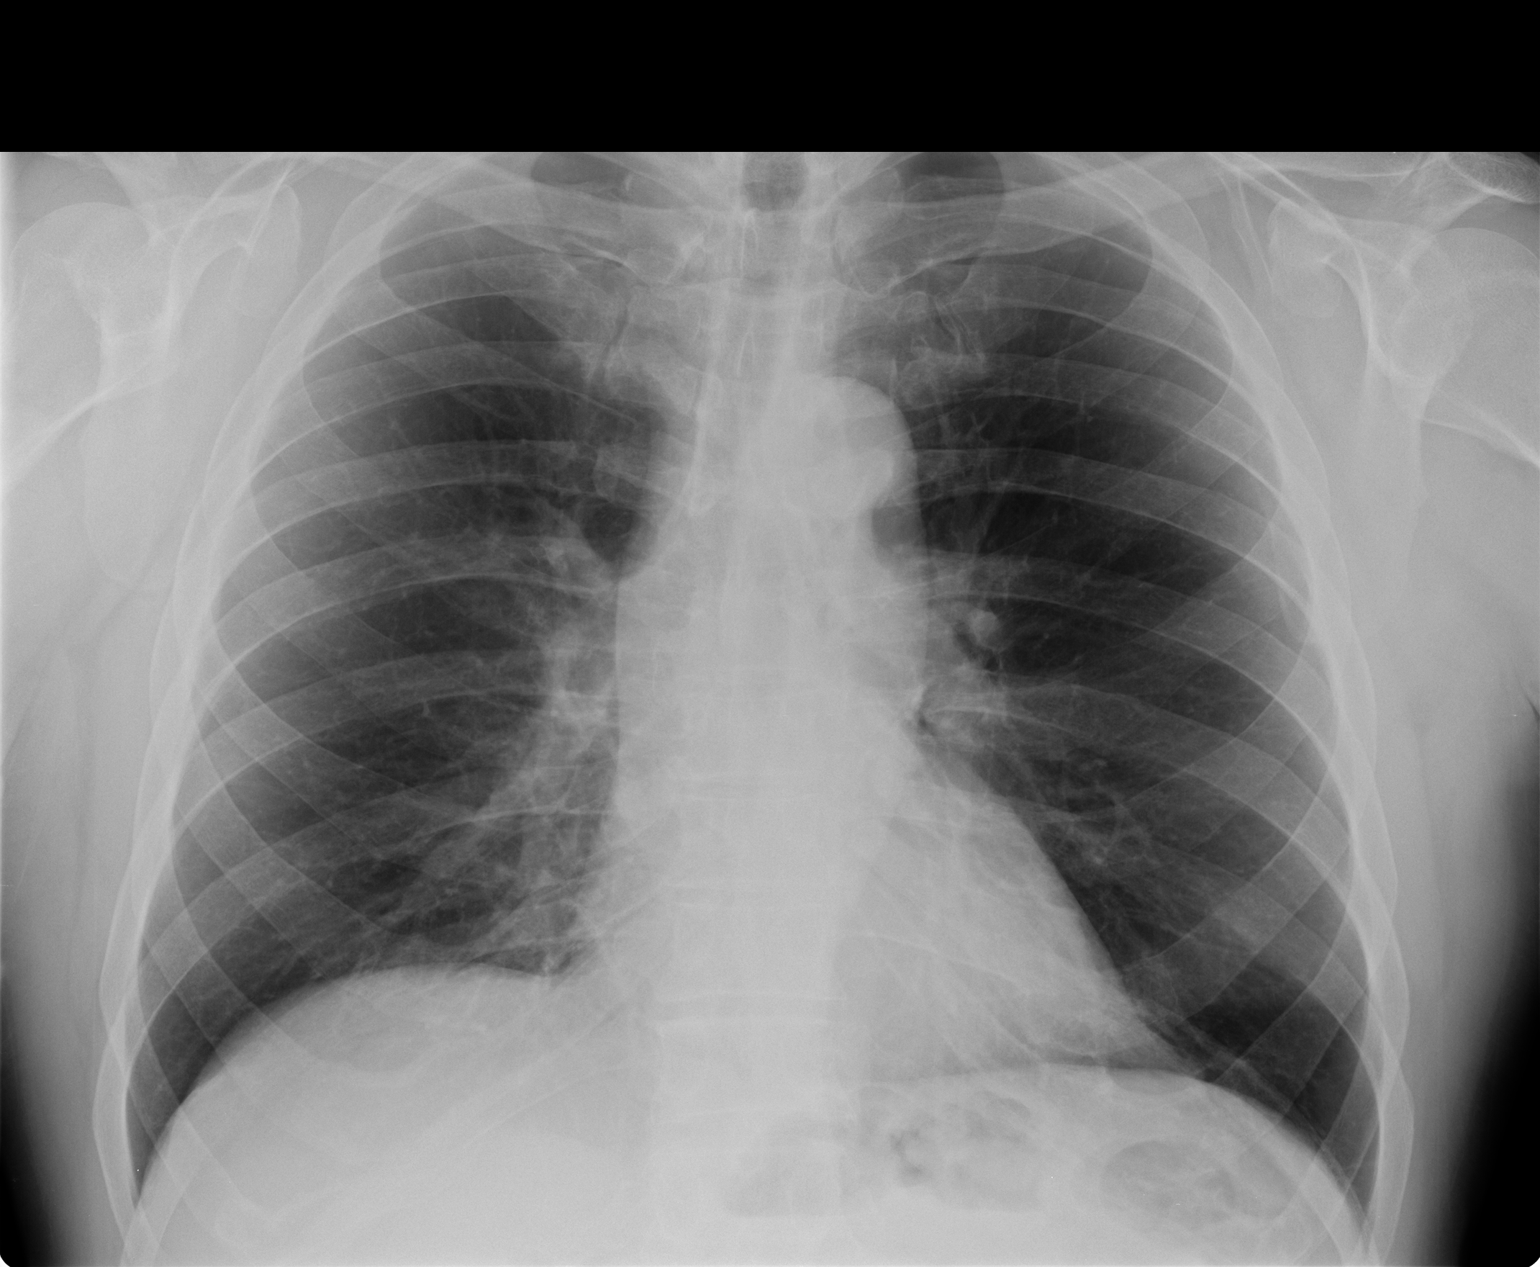

[view not recorded (2 of 2)]
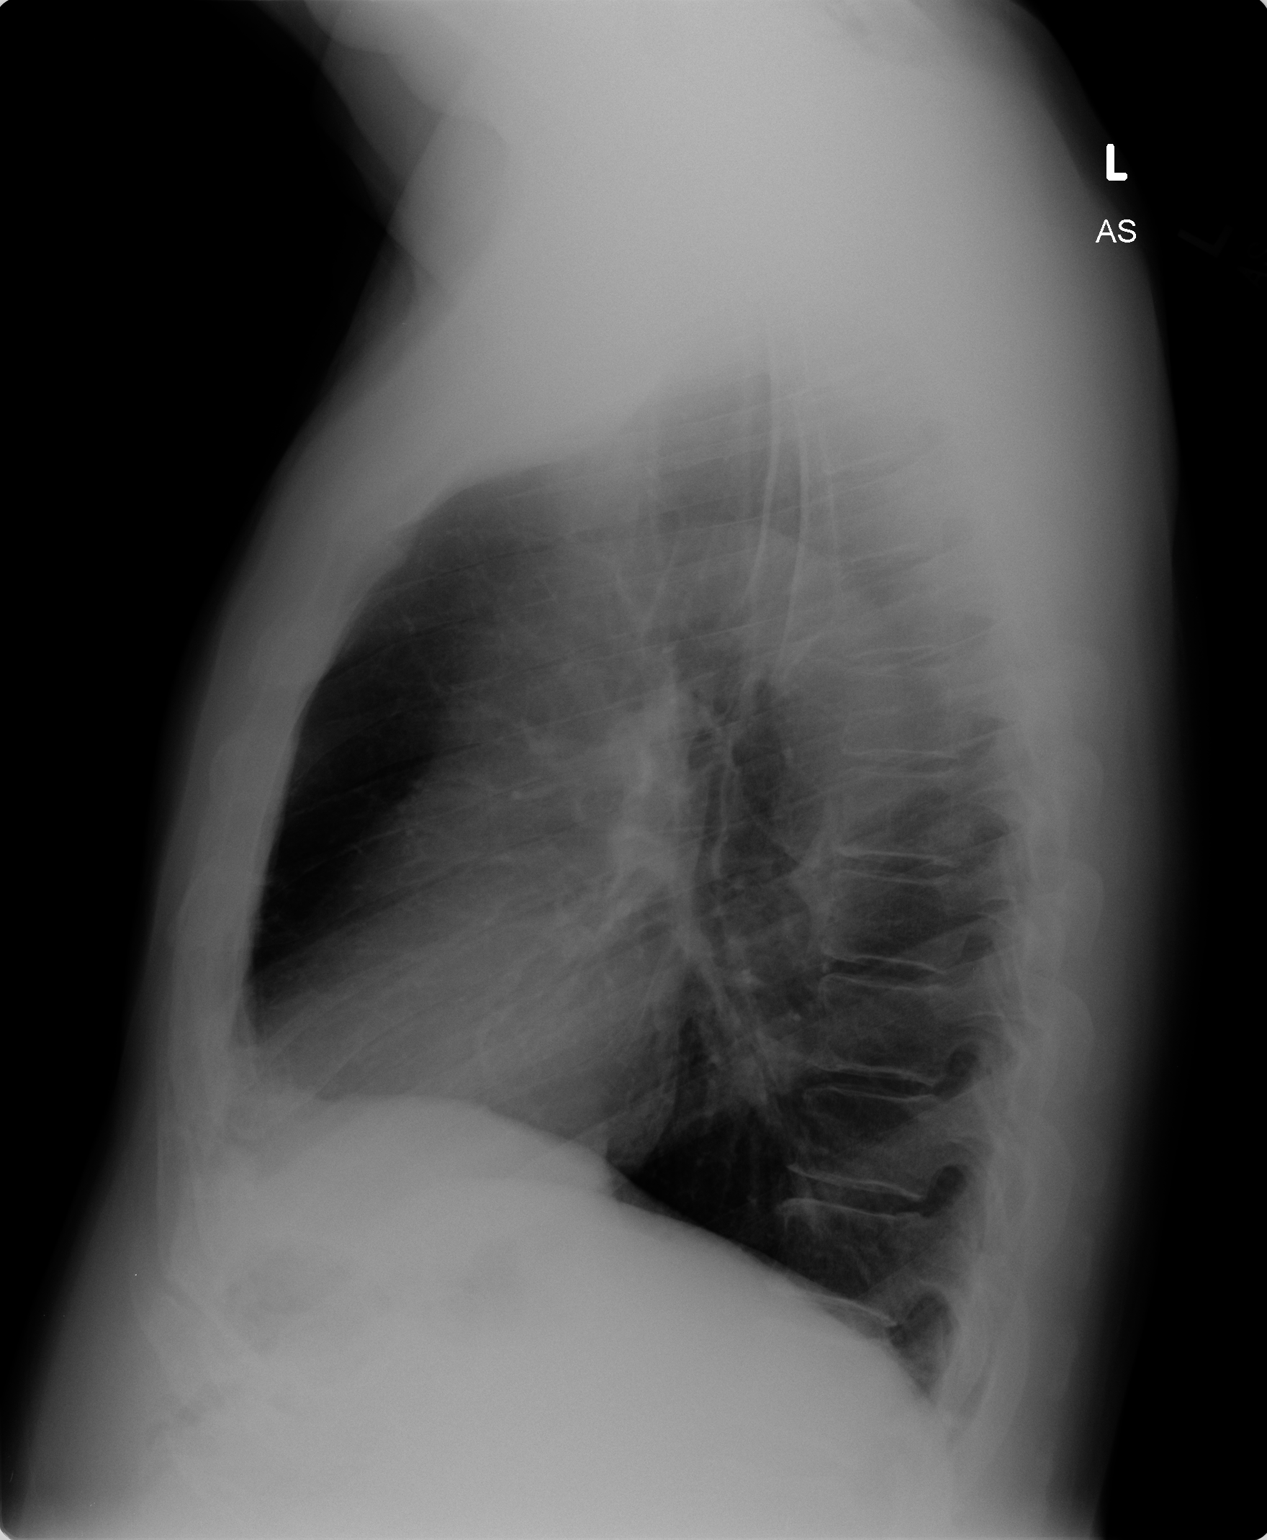

[2 of 2 positions shown; findings below may reference images not displayed]

FINDINGS: The heart size and mediastinal contours are within normal limits.
Both lungs are clear. The visualized skeletal structures are
unremarkable.
IMPRESSION: No active cardiopulmonary disease.

## 2017-02-27 IMAGING — US US ABDOMEN LIMITED
1 series · 14 of 25 positions shown · non-contrast
Comparison: None.

CLINICAL DATA: 70-year-old with approximate 3 week history of right
upper quadrant abdominal pain and right-sided chest pain.

EXAM:
US ABDOMEN LIMITED - RIGHT UPPER QUADRANT

[Series 1: us abdomen limited · 0.32mm/px · 14 of 43 slices shown]
[im 1/43]
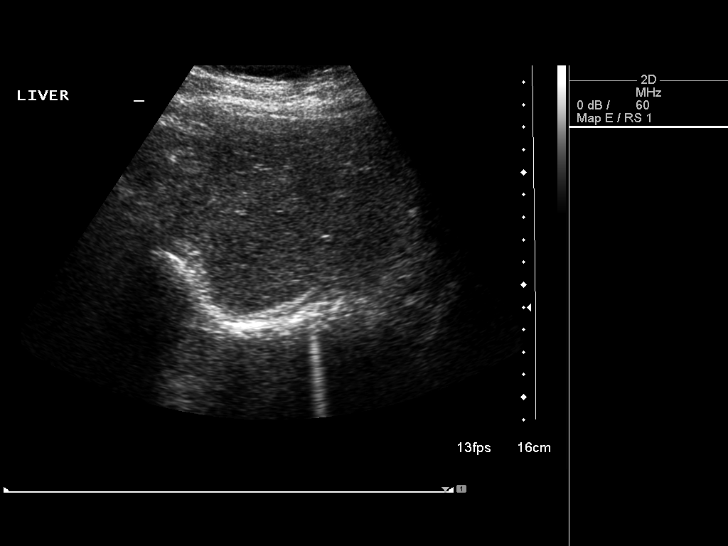
[im 4/43]
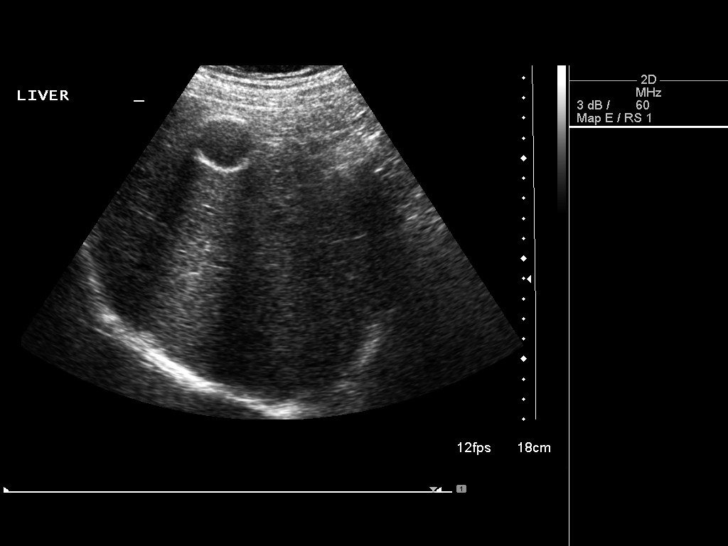
[im 8/43]
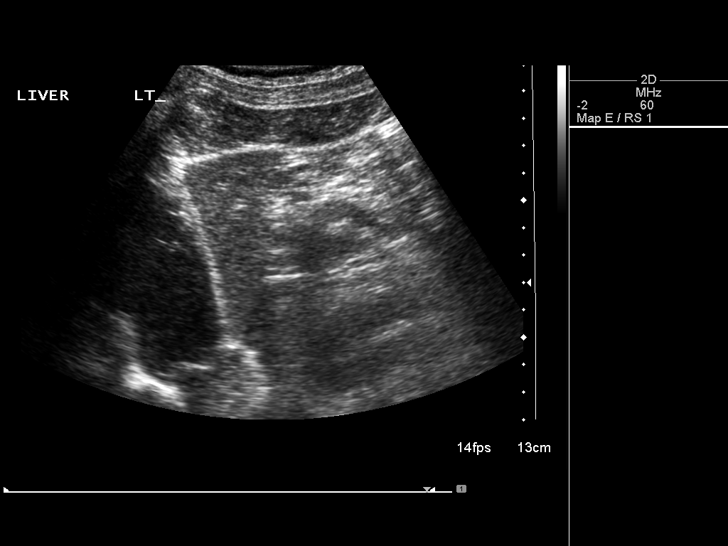
[im 11/43]
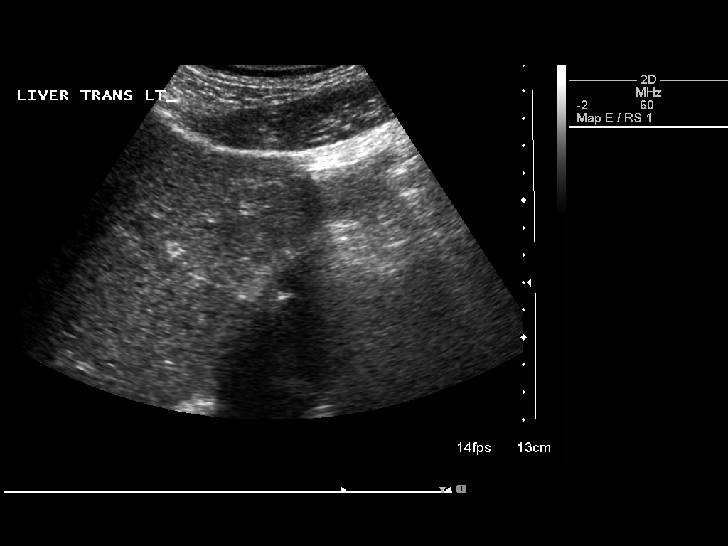
[im 15/43]
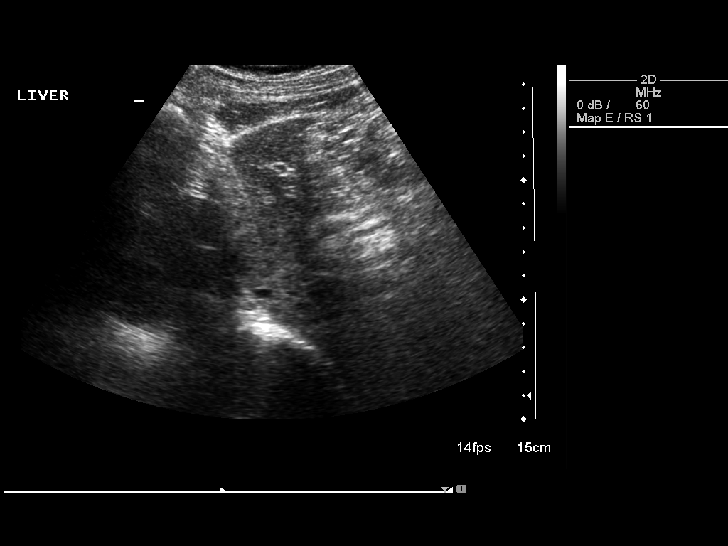
[im 16/43]
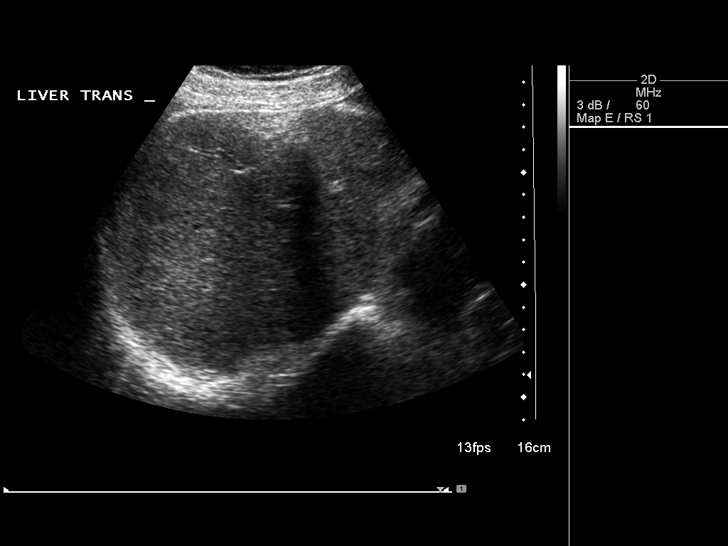
[im 20/43]
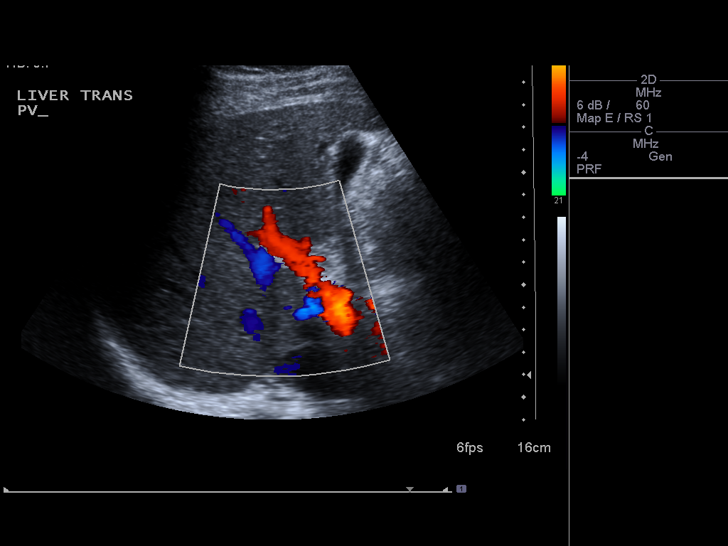
[im 23/43]
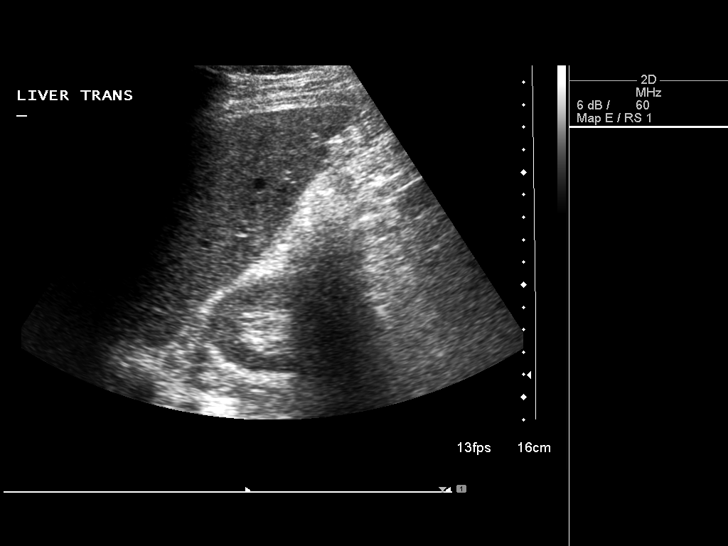
[im 27/43]
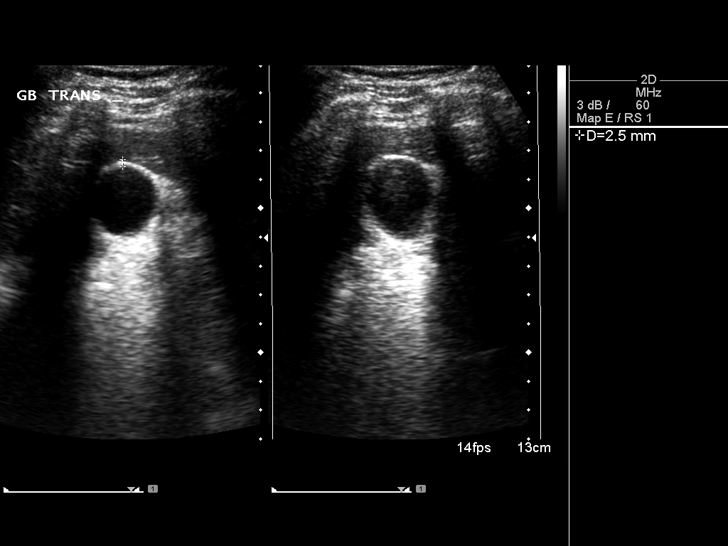
[im 29/43]
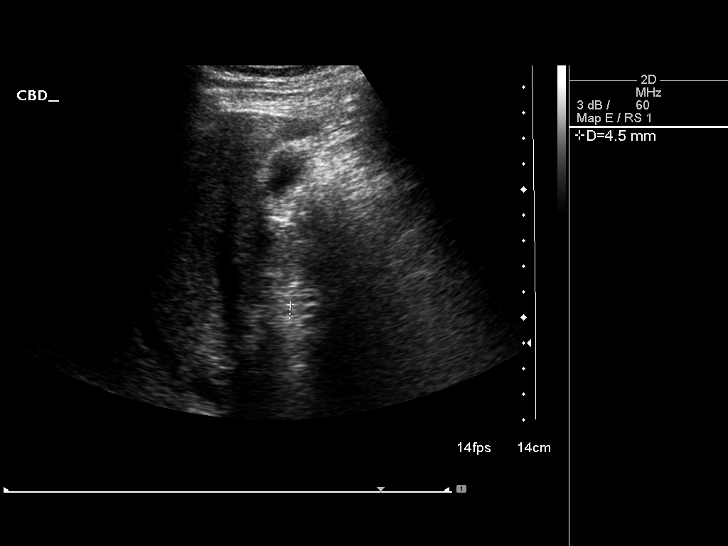
[im 32/43]
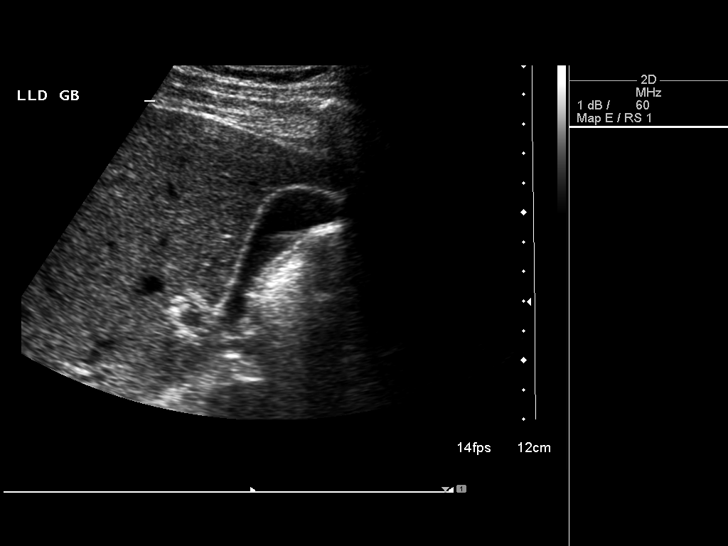
[im 36/43]
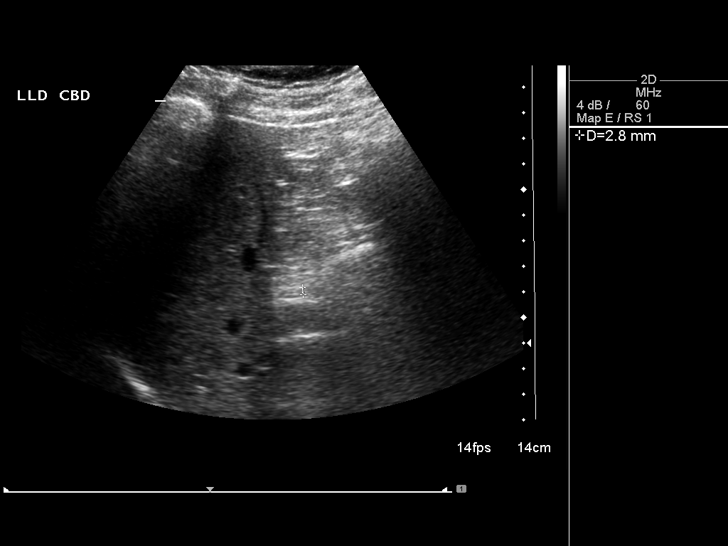
[im 39/43]
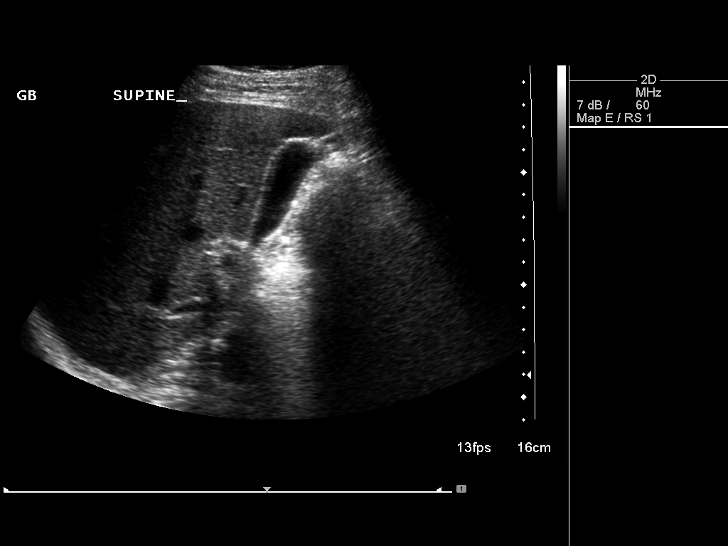
[im 43/43]
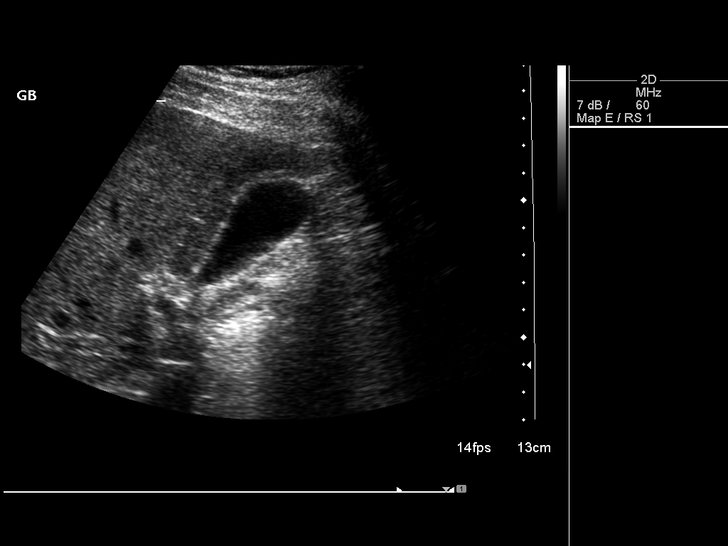

[14 of 25 positions shown; findings below may reference images not displayed]

FINDINGS: Gallbladder:

Gallbladder contracted. No shadowing gallstones. No pericholecystic
fluid. No gallbladder wall thickening. Echogenic foci within the
gallbladder wall. Negative sonographic Murphy sign according to the
ultrasound technologist.

Common bile duct:

Diameter: Approximately 5 mm.

Liver:

7 mm benign cyst in the medial segment left lobe. No significant
focal parenchymal abnormality. Normal parenchymal echotexture.
Patent portal vein with hepatopetal flow.
IMPRESSION: 1. Contracted gallbladder. No evidence of cholelithiasis or acute
cholecystitis. Possible calcification in the gallbladder wall
(porcelain gallbladder).
2. 7 mm benign cyst in the medial segment left lobe of liver. No
significant abnormalities involving the liver.

## 2017-04-19 DIAGNOSIS — J069 Acute upper respiratory infection, unspecified: Secondary | ICD-10-CM | POA: Diagnosis not present

## 2017-08-22 DIAGNOSIS — R1013 Epigastric pain: Secondary | ICD-10-CM | POA: Diagnosis not present

## 2017-08-22 DIAGNOSIS — F419 Anxiety disorder, unspecified: Secondary | ICD-10-CM | POA: Diagnosis not present

## 2017-08-22 DIAGNOSIS — N5201 Erectile dysfunction due to arterial insufficiency: Secondary | ICD-10-CM | POA: Diagnosis not present

## 2017-08-22 DIAGNOSIS — Z1159 Encounter for screening for other viral diseases: Secondary | ICD-10-CM | POA: Diagnosis not present

## 2017-08-22 DIAGNOSIS — I10 Essential (primary) hypertension: Secondary | ICD-10-CM | POA: Diagnosis not present

## 2017-09-26 DIAGNOSIS — R1011 Right upper quadrant pain: Secondary | ICD-10-CM | POA: Diagnosis not present

## 2018-02-01 ENCOUNTER — Ambulatory Visit
Admission: RE | Admit: 2018-02-01 | Discharge: 2018-02-01 | Disposition: A | Discharge status: Home / Self Care | Payer: Medicare Other | Source: Ambulatory Visit | Attending: Internal Medicine | Admitting: Internal Medicine

## 2018-02-01 ENCOUNTER — Other Ambulatory Visit: Payer: Self-pay | Admitting: Internal Medicine

## 2018-02-01 DIAGNOSIS — M5442 Lumbago with sciatica, left side: Secondary | ICD-10-CM

## 2018-02-01 DIAGNOSIS — M5136 Other intervertebral disc degeneration, lumbar region: Secondary | ICD-10-CM | POA: Diagnosis not present

## 2018-02-28 DIAGNOSIS — I10 Essential (primary) hypertension: Secondary | ICD-10-CM | POA: Diagnosis not present

## 2018-08-21 DIAGNOSIS — Z872 Personal history of diseases of the skin and subcutaneous tissue: Secondary | ICD-10-CM | POA: Diagnosis not present

## 2018-08-21 DIAGNOSIS — L570 Actinic keratosis: Secondary | ICD-10-CM | POA: Diagnosis not present

## 2018-08-21 DIAGNOSIS — L720 Epidermal cyst: Secondary | ICD-10-CM | POA: Diagnosis not present

## 2018-08-21 DIAGNOSIS — L821 Other seborrheic keratosis: Secondary | ICD-10-CM | POA: Diagnosis not present

## 2018-08-23 DIAGNOSIS — K219 Gastro-esophageal reflux disease without esophagitis: Secondary | ICD-10-CM | POA: Diagnosis not present

## 2018-08-23 DIAGNOSIS — K580 Irritable bowel syndrome with diarrhea: Secondary | ICD-10-CM | POA: Diagnosis not present

## 2018-08-23 DIAGNOSIS — Z1389 Encounter for screening for other disorder: Secondary | ICD-10-CM | POA: Diagnosis not present

## 2018-08-23 DIAGNOSIS — Z0000 Encounter for general adult medical examination without abnormal findings: Secondary | ICD-10-CM | POA: Diagnosis not present

## 2018-08-23 DIAGNOSIS — I10 Essential (primary) hypertension: Secondary | ICD-10-CM | POA: Diagnosis not present

## 2018-12-04 DIAGNOSIS — Z86010 Personal history of colonic polyps: Secondary | ICD-10-CM | POA: Diagnosis not present

## 2018-12-04 DIAGNOSIS — D125 Benign neoplasm of sigmoid colon: Secondary | ICD-10-CM | POA: Diagnosis not present

## 2018-12-04 DIAGNOSIS — D120 Benign neoplasm of cecum: Secondary | ICD-10-CM | POA: Diagnosis not present

## 2018-12-04 DIAGNOSIS — D122 Benign neoplasm of ascending colon: Secondary | ICD-10-CM | POA: Diagnosis not present

## 2018-12-19 DIAGNOSIS — H524 Presbyopia: Secondary | ICD-10-CM | POA: Diagnosis not present

## 2019-03-07 DIAGNOSIS — J45909 Unspecified asthma, uncomplicated: Secondary | ICD-10-CM | POA: Diagnosis not present

## 2019-03-21 DIAGNOSIS — F419 Anxiety disorder, unspecified: Secondary | ICD-10-CM | POA: Diagnosis not present

## 2019-03-21 DIAGNOSIS — J302 Other seasonal allergic rhinitis: Secondary | ICD-10-CM | POA: Diagnosis not present

## 2019-09-02 DIAGNOSIS — Z0000 Encounter for general adult medical examination without abnormal findings: Secondary | ICD-10-CM | POA: Diagnosis not present

## 2019-09-02 DIAGNOSIS — K219 Gastro-esophageal reflux disease without esophagitis: Secondary | ICD-10-CM | POA: Diagnosis not present

## 2019-09-02 DIAGNOSIS — I10 Essential (primary) hypertension: Secondary | ICD-10-CM | POA: Diagnosis not present

## 2019-09-02 DIAGNOSIS — R5382 Chronic fatigue, unspecified: Secondary | ICD-10-CM | POA: Diagnosis not present

## 2019-09-02 DIAGNOSIS — K580 Irritable bowel syndrome with diarrhea: Secondary | ICD-10-CM | POA: Diagnosis not present

## 2019-09-02 DIAGNOSIS — Z1389 Encounter for screening for other disorder: Secondary | ICD-10-CM | POA: Diagnosis not present

## 2019-10-15 DIAGNOSIS — H1033 Unspecified acute conjunctivitis, bilateral: Secondary | ICD-10-CM | POA: Diagnosis not present

## 2019-10-21 DIAGNOSIS — B070 Plantar wart: Secondary | ICD-10-CM | POA: Diagnosis not present

## 2019-10-21 DIAGNOSIS — L570 Actinic keratosis: Secondary | ICD-10-CM | POA: Diagnosis not present

## 2019-10-21 DIAGNOSIS — Z872 Personal history of diseases of the skin and subcutaneous tissue: Secondary | ICD-10-CM | POA: Diagnosis not present

## 2019-10-21 DIAGNOSIS — D229 Melanocytic nevi, unspecified: Secondary | ICD-10-CM | POA: Diagnosis not present

## 2019-10-21 DIAGNOSIS — L738 Other specified follicular disorders: Secondary | ICD-10-CM | POA: Diagnosis not present

## 2019-10-21 DIAGNOSIS — L821 Other seborrheic keratosis: Secondary | ICD-10-CM | POA: Diagnosis not present

## 2019-12-15 ENCOUNTER — Ambulatory Visit: Payer: Medicare Other

## 2020-03-03 DIAGNOSIS — M542 Cervicalgia: Secondary | ICD-10-CM | POA: Diagnosis not present

## 2020-03-03 DIAGNOSIS — K219 Gastro-esophageal reflux disease without esophagitis: Secondary | ICD-10-CM | POA: Diagnosis not present

## 2020-03-03 DIAGNOSIS — R5382 Chronic fatigue, unspecified: Secondary | ICD-10-CM | POA: Diagnosis not present

## 2020-03-03 DIAGNOSIS — I10 Essential (primary) hypertension: Secondary | ICD-10-CM | POA: Diagnosis not present

## 2020-03-06 DIAGNOSIS — J45909 Unspecified asthma, uncomplicated: Secondary | ICD-10-CM | POA: Diagnosis not present

## 2020-03-06 DIAGNOSIS — I10 Essential (primary) hypertension: Secondary | ICD-10-CM | POA: Diagnosis not present

## 2020-08-04 DIAGNOSIS — R6881 Early satiety: Secondary | ICD-10-CM | POA: Diagnosis not present

## 2020-08-04 DIAGNOSIS — K219 Gastro-esophageal reflux disease without esophagitis: Secondary | ICD-10-CM | POA: Diagnosis not present

## 2020-08-04 DIAGNOSIS — R1012 Left upper quadrant pain: Secondary | ICD-10-CM | POA: Diagnosis not present

## 2020-08-04 DIAGNOSIS — R634 Abnormal weight loss: Secondary | ICD-10-CM | POA: Diagnosis not present

## 2020-08-07 DIAGNOSIS — Z0120 Encounter for dental examination and cleaning without abnormal findings: Secondary | ICD-10-CM | POA: Diagnosis not present

## 2020-08-21 DIAGNOSIS — Z872 Personal history of diseases of the skin and subcutaneous tissue: Secondary | ICD-10-CM | POA: Diagnosis not present

## 2020-08-21 DIAGNOSIS — D492 Neoplasm of unspecified behavior of bone, soft tissue, and skin: Secondary | ICD-10-CM | POA: Diagnosis not present

## 2020-08-21 DIAGNOSIS — L720 Epidermal cyst: Secondary | ICD-10-CM | POA: Diagnosis not present

## 2020-08-21 DIAGNOSIS — L570 Actinic keratosis: Secondary | ICD-10-CM | POA: Diagnosis not present

## 2020-09-04 DIAGNOSIS — Z0000 Encounter for general adult medical examination without abnormal findings: Secondary | ICD-10-CM | POA: Diagnosis not present

## 2020-09-04 DIAGNOSIS — M25532 Pain in left wrist: Secondary | ICD-10-CM | POA: Diagnosis not present

## 2020-09-04 DIAGNOSIS — I10 Essential (primary) hypertension: Secondary | ICD-10-CM | POA: Diagnosis not present

## 2020-09-04 DIAGNOSIS — K580 Irritable bowel syndrome with diarrhea: Secondary | ICD-10-CM | POA: Diagnosis not present

## 2020-09-04 DIAGNOSIS — K219 Gastro-esophageal reflux disease without esophagitis: Secondary | ICD-10-CM | POA: Diagnosis not present

## 2020-09-04 DIAGNOSIS — Z1389 Encounter for screening for other disorder: Secondary | ICD-10-CM | POA: Diagnosis not present

## 2020-09-18 DIAGNOSIS — Z1159 Encounter for screening for other viral diseases: Secondary | ICD-10-CM | POA: Diagnosis not present

## 2020-09-23 DIAGNOSIS — R1012 Left upper quadrant pain: Secondary | ICD-10-CM | POA: Diagnosis not present

## 2020-09-23 DIAGNOSIS — R1013 Epigastric pain: Secondary | ICD-10-CM | POA: Diagnosis not present

## 2020-09-23 DIAGNOSIS — B9681 Helicobacter pylori [H. pylori] as the cause of diseases classified elsewhere: Secondary | ICD-10-CM | POA: Diagnosis not present

## 2020-09-23 DIAGNOSIS — K2930 Chronic superficial gastritis without bleeding: Secondary | ICD-10-CM | POA: Diagnosis not present

## 2020-09-30 DIAGNOSIS — K2930 Chronic superficial gastritis without bleeding: Secondary | ICD-10-CM | POA: Diagnosis not present

## 2020-09-30 DIAGNOSIS — B9681 Helicobacter pylori [H. pylori] as the cause of diseases classified elsewhere: Secondary | ICD-10-CM | POA: Diagnosis not present

## 2020-10-22 DIAGNOSIS — T7840XA Allergy, unspecified, initial encounter: Secondary | ICD-10-CM | POA: Diagnosis not present

## 2020-10-22 DIAGNOSIS — R502 Drug induced fever: Secondary | ICD-10-CM | POA: Diagnosis not present

## 2020-11-30 DIAGNOSIS — J45909 Unspecified asthma, uncomplicated: Secondary | ICD-10-CM | POA: Diagnosis not present

## 2020-11-30 DIAGNOSIS — K219 Gastro-esophageal reflux disease without esophagitis: Secondary | ICD-10-CM | POA: Diagnosis not present

## 2020-11-30 DIAGNOSIS — I10 Essential (primary) hypertension: Secondary | ICD-10-CM | POA: Diagnosis not present

## 2020-12-31 DIAGNOSIS — B9681 Helicobacter pylori [H. pylori] as the cause of diseases classified elsewhere: Secondary | ICD-10-CM | POA: Diagnosis not present

## 2021-02-01 DIAGNOSIS — H524 Presbyopia: Secondary | ICD-10-CM | POA: Diagnosis not present

## 2021-03-09 DIAGNOSIS — Z8619 Personal history of other infectious and parasitic diseases: Secondary | ICD-10-CM | POA: Diagnosis not present

## 2021-03-09 DIAGNOSIS — I10 Essential (primary) hypertension: Secondary | ICD-10-CM | POA: Diagnosis not present

## 2021-03-09 DIAGNOSIS — K580 Irritable bowel syndrome with diarrhea: Secondary | ICD-10-CM | POA: Diagnosis not present

## 2021-03-09 DIAGNOSIS — K219 Gastro-esophageal reflux disease without esophagitis: Secondary | ICD-10-CM | POA: Diagnosis not present

## 2021-05-18 DIAGNOSIS — R002 Palpitations: Secondary | ICD-10-CM | POA: Diagnosis not present

## 2021-05-19 DIAGNOSIS — R002 Palpitations: Secondary | ICD-10-CM | POA: Diagnosis not present

## 2021-05-26 DIAGNOSIS — R0681 Apnea, not elsewhere classified: Secondary | ICD-10-CM | POA: Diagnosis not present

## 2021-05-27 DIAGNOSIS — K219 Gastro-esophageal reflux disease without esophagitis: Secondary | ICD-10-CM | POA: Diagnosis not present

## 2021-05-27 DIAGNOSIS — I10 Essential (primary) hypertension: Secondary | ICD-10-CM | POA: Diagnosis not present

## 2021-05-27 DIAGNOSIS — J45909 Unspecified asthma, uncomplicated: Secondary | ICD-10-CM | POA: Diagnosis not present

## 2021-06-02 DIAGNOSIS — R002 Palpitations: Secondary | ICD-10-CM | POA: Diagnosis not present

## 2021-06-15 DIAGNOSIS — R0683 Snoring: Secondary | ICD-10-CM | POA: Diagnosis not present

## 2021-06-15 DIAGNOSIS — R002 Palpitations: Secondary | ICD-10-CM | POA: Diagnosis not present

## 2021-06-16 DIAGNOSIS — R002 Palpitations: Secondary | ICD-10-CM | POA: Diagnosis not present

## 2021-06-17 DIAGNOSIS — G4733 Obstructive sleep apnea (adult) (pediatric): Secondary | ICD-10-CM | POA: Diagnosis not present

## 2021-07-10 DIAGNOSIS — J209 Acute bronchitis, unspecified: Secondary | ICD-10-CM | POA: Diagnosis not present

## 2021-08-10 DIAGNOSIS — M67911 Unspecified disorder of synovium and tendon, right shoulder: Secondary | ICD-10-CM | POA: Diagnosis not present

## 2021-08-25 DIAGNOSIS — L738 Other specified follicular disorders: Secondary | ICD-10-CM | POA: Diagnosis not present

## 2021-08-25 DIAGNOSIS — L821 Other seborrheic keratosis: Secondary | ICD-10-CM | POA: Diagnosis not present

## 2021-08-25 DIAGNOSIS — Z872 Personal history of diseases of the skin and subcutaneous tissue: Secondary | ICD-10-CM | POA: Diagnosis not present

## 2021-08-25 DIAGNOSIS — L570 Actinic keratosis: Secondary | ICD-10-CM | POA: Diagnosis not present

## 2021-08-31 DIAGNOSIS — M25511 Pain in right shoulder: Secondary | ICD-10-CM | POA: Diagnosis not present

## 2021-09-06 DIAGNOSIS — M67911 Unspecified disorder of synovium and tendon, right shoulder: Secondary | ICD-10-CM | POA: Diagnosis not present

## 2021-09-10 DIAGNOSIS — K580 Irritable bowel syndrome with diarrhea: Secondary | ICD-10-CM | POA: Diagnosis not present

## 2021-09-10 DIAGNOSIS — Z13220 Encounter for screening for lipoid disorders: Secondary | ICD-10-CM | POA: Diagnosis not present

## 2021-09-10 DIAGNOSIS — Z1389 Encounter for screening for other disorder: Secondary | ICD-10-CM | POA: Diagnosis not present

## 2021-09-10 DIAGNOSIS — F419 Anxiety disorder, unspecified: Secondary | ICD-10-CM | POA: Diagnosis not present

## 2021-09-10 DIAGNOSIS — I10 Essential (primary) hypertension: Secondary | ICD-10-CM | POA: Diagnosis not present

## 2021-09-10 DIAGNOSIS — J302 Other seasonal allergic rhinitis: Secondary | ICD-10-CM | POA: Diagnosis not present

## 2021-09-10 DIAGNOSIS — Z0000 Encounter for general adult medical examination without abnormal findings: Secondary | ICD-10-CM | POA: Diagnosis not present

## 2021-09-14 DIAGNOSIS — H25013 Cortical age-related cataract, bilateral: Secondary | ICD-10-CM | POA: Diagnosis not present

## 2021-09-14 DIAGNOSIS — H0100A Unspecified blepharitis right eye, upper and lower eyelids: Secondary | ICD-10-CM | POA: Diagnosis not present

## 2021-09-14 DIAGNOSIS — H04123 Dry eye syndrome of bilateral lacrimal glands: Secondary | ICD-10-CM | POA: Diagnosis not present

## 2021-09-14 DIAGNOSIS — H18592 Other hereditary corneal dystrophies, left eye: Secondary | ICD-10-CM | POA: Diagnosis not present

## 2021-11-10 DIAGNOSIS — I10 Essential (primary) hypertension: Secondary | ICD-10-CM | POA: Diagnosis not present

## 2021-11-10 DIAGNOSIS — K219 Gastro-esophageal reflux disease without esophagitis: Secondary | ICD-10-CM | POA: Diagnosis not present

## 2021-12-08 DIAGNOSIS — Z87442 Personal history of urinary calculi: Secondary | ICD-10-CM | POA: Diagnosis not present

## 2021-12-08 DIAGNOSIS — N23 Unspecified renal colic: Secondary | ICD-10-CM | POA: Diagnosis not present

## 2021-12-08 DIAGNOSIS — R109 Unspecified abdominal pain: Secondary | ICD-10-CM | POA: Diagnosis not present

## 2021-12-09 ENCOUNTER — Other Ambulatory Visit: Payer: Self-pay | Admitting: Physician Assistant

## 2021-12-09 ENCOUNTER — Ambulatory Visit
Admission: RE | Admit: 2021-12-09 | Discharge: 2021-12-09 | Disposition: A | Discharge status: Home / Self Care | Payer: Medicare Other | Source: Ambulatory Visit | Attending: Physician Assistant | Admitting: Physician Assistant

## 2021-12-09 DIAGNOSIS — R109 Unspecified abdominal pain: Secondary | ICD-10-CM

## 2021-12-09 DIAGNOSIS — Z87442 Personal history of urinary calculi: Secondary | ICD-10-CM

## 2021-12-09 DIAGNOSIS — N23 Unspecified renal colic: Secondary | ICD-10-CM

## 2022-02-14 DIAGNOSIS — T1511XA Foreign body in conjunctival sac, right eye, initial encounter: Secondary | ICD-10-CM | POA: Diagnosis not present

## 2022-02-14 DIAGNOSIS — H0100A Unspecified blepharitis right eye, upper and lower eyelids: Secondary | ICD-10-CM | POA: Diagnosis not present

## 2022-02-21 DIAGNOSIS — H16223 Keratoconjunctivitis sicca, not specified as Sjogren's, bilateral: Secondary | ICD-10-CM | POA: Diagnosis not present

## 2022-02-21 DIAGNOSIS — L719 Rosacea, unspecified: Secondary | ICD-10-CM | POA: Diagnosis not present

## 2022-03-18 DIAGNOSIS — K219 Gastro-esophageal reflux disease without esophagitis: Secondary | ICD-10-CM | POA: Diagnosis not present

## 2022-03-18 DIAGNOSIS — F5104 Psychophysiologic insomnia: Secondary | ICD-10-CM | POA: Diagnosis not present

## 2022-03-18 DIAGNOSIS — I10 Essential (primary) hypertension: Secondary | ICD-10-CM | POA: Diagnosis not present

## 2022-03-18 DIAGNOSIS — K580 Irritable bowel syndrome with diarrhea: Secondary | ICD-10-CM | POA: Diagnosis not present

## 2022-04-07 DIAGNOSIS — H16223 Keratoconjunctivitis sicca, not specified as Sjogren's, bilateral: Secondary | ICD-10-CM | POA: Diagnosis not present

## 2022-04-18 DIAGNOSIS — K219 Gastro-esophageal reflux disease without esophagitis: Secondary | ICD-10-CM | POA: Diagnosis not present

## 2022-04-18 DIAGNOSIS — I10 Essential (primary) hypertension: Secondary | ICD-10-CM | POA: Diagnosis not present

## 2022-04-27 DIAGNOSIS — L821 Other seborrheic keratosis: Secondary | ICD-10-CM | POA: Diagnosis not present

## 2022-05-19 DIAGNOSIS — Z719 Counseling, unspecified: Secondary | ICD-10-CM | POA: Diagnosis not present

## 2022-05-19 DIAGNOSIS — H16223 Keratoconjunctivitis sicca, not specified as Sjogren's, bilateral: Secondary | ICD-10-CM | POA: Diagnosis not present

## 2022-08-25 DIAGNOSIS — L821 Other seborrheic keratosis: Secondary | ICD-10-CM | POA: Diagnosis not present

## 2022-08-25 DIAGNOSIS — Z872 Personal history of diseases of the skin and subcutaneous tissue: Secondary | ICD-10-CM | POA: Diagnosis not present

## 2022-09-19 DIAGNOSIS — F5104 Psychophysiologic insomnia: Secondary | ICD-10-CM | POA: Diagnosis not present

## 2022-09-19 DIAGNOSIS — I10 Essential (primary) hypertension: Secondary | ICD-10-CM | POA: Diagnosis not present

## 2022-09-19 DIAGNOSIS — K219 Gastro-esophageal reflux disease without esophagitis: Secondary | ICD-10-CM | POA: Diagnosis not present

## 2022-10-14 DIAGNOSIS — D696 Thrombocytopenia, unspecified: Secondary | ICD-10-CM | POA: Diagnosis not present

## 2022-10-14 DIAGNOSIS — K589 Irritable bowel syndrome without diarrhea: Secondary | ICD-10-CM | POA: Diagnosis not present

## 2022-10-14 DIAGNOSIS — I10 Essential (primary) hypertension: Secondary | ICD-10-CM | POA: Diagnosis not present

## 2022-10-14 DIAGNOSIS — Z0000 Encounter for general adult medical examination without abnormal findings: Secondary | ICD-10-CM | POA: Diagnosis not present

## 2022-10-14 DIAGNOSIS — R5382 Chronic fatigue, unspecified: Secondary | ICD-10-CM | POA: Diagnosis not present

## 2022-10-14 DIAGNOSIS — G4733 Obstructive sleep apnea (adult) (pediatric): Secondary | ICD-10-CM | POA: Diagnosis not present

## 2022-10-14 DIAGNOSIS — Z1331 Encounter for screening for depression: Secondary | ICD-10-CM | POA: Diagnosis not present

## 2022-10-14 DIAGNOSIS — J019 Acute sinusitis, unspecified: Secondary | ICD-10-CM | POA: Diagnosis not present

## 2022-10-20 DIAGNOSIS — H16223 Keratoconjunctivitis sicca, not specified as Sjogren's, bilateral: Secondary | ICD-10-CM | POA: Diagnosis not present

## 2022-10-20 DIAGNOSIS — H0288A Meibomian gland dysfunction right eye, upper and lower eyelids: Secondary | ICD-10-CM | POA: Diagnosis not present

## 2022-10-20 DIAGNOSIS — H0288B Meibomian gland dysfunction left eye, upper and lower eyelids: Secondary | ICD-10-CM | POA: Diagnosis not present

## 2022-10-20 DIAGNOSIS — L718 Other rosacea: Secondary | ICD-10-CM | POA: Diagnosis not present

## 2022-11-02 DIAGNOSIS — D696 Thrombocytopenia, unspecified: Secondary | ICD-10-CM | POA: Diagnosis not present

## 2022-11-09 DIAGNOSIS — K080 Exfoliation of teeth due to systemic causes: Secondary | ICD-10-CM | POA: Diagnosis not present

## 2023-03-15 DIAGNOSIS — F419 Anxiety disorder, unspecified: Secondary | ICD-10-CM | POA: Diagnosis not present

## 2023-03-15 DIAGNOSIS — K219 Gastro-esophageal reflux disease without esophagitis: Secondary | ICD-10-CM | POA: Diagnosis not present

## 2023-03-15 DIAGNOSIS — R002 Palpitations: Secondary | ICD-10-CM | POA: Diagnosis not present

## 2023-03-15 DIAGNOSIS — I10 Essential (primary) hypertension: Secondary | ICD-10-CM | POA: Diagnosis not present

## 2023-06-06 DIAGNOSIS — K080 Exfoliation of teeth due to systemic causes: Secondary | ICD-10-CM | POA: Diagnosis not present

## 2023-06-09 DIAGNOSIS — R1013 Epigastric pain: Secondary | ICD-10-CM | POA: Diagnosis not present

## 2023-08-16 DIAGNOSIS — L718 Other rosacea: Secondary | ICD-10-CM | POA: Diagnosis not present

## 2023-08-16 DIAGNOSIS — H43812 Vitreous degeneration, left eye: Secondary | ICD-10-CM | POA: Diagnosis not present

## 2023-08-16 DIAGNOSIS — H0288A Meibomian gland dysfunction right eye, upper and lower eyelids: Secondary | ICD-10-CM | POA: Diagnosis not present

## 2023-08-16 DIAGNOSIS — H35033 Hypertensive retinopathy, bilateral: Secondary | ICD-10-CM | POA: Diagnosis not present

## 2023-08-16 DIAGNOSIS — H0288B Meibomian gland dysfunction left eye, upper and lower eyelids: Secondary | ICD-10-CM | POA: Diagnosis not present

## 2023-08-16 DIAGNOSIS — I10 Essential (primary) hypertension: Secondary | ICD-10-CM | POA: Diagnosis not present

## 2023-08-16 DIAGNOSIS — H16223 Keratoconjunctivitis sicca, not specified as Sjogren's, bilateral: Secondary | ICD-10-CM | POA: Diagnosis not present

## 2023-08-26 DIAGNOSIS — H524 Presbyopia: Secondary | ICD-10-CM | POA: Diagnosis not present

## 2023-08-30 DIAGNOSIS — L570 Actinic keratosis: Secondary | ICD-10-CM | POA: Diagnosis not present

## 2023-08-30 DIAGNOSIS — L814 Other melanin hyperpigmentation: Secondary | ICD-10-CM | POA: Diagnosis not present

## 2023-08-30 DIAGNOSIS — L821 Other seborrheic keratosis: Secondary | ICD-10-CM | POA: Diagnosis not present

## 2023-08-30 DIAGNOSIS — Z872 Personal history of diseases of the skin and subcutaneous tissue: Secondary | ICD-10-CM | POA: Diagnosis not present

## 2023-10-19 DIAGNOSIS — F419 Anxiety disorder, unspecified: Secondary | ICD-10-CM | POA: Diagnosis not present

## 2023-10-19 DIAGNOSIS — E538 Deficiency of other specified B group vitamins: Secondary | ICD-10-CM | POA: Diagnosis not present

## 2023-10-19 DIAGNOSIS — N5201 Erectile dysfunction due to arterial insufficiency: Secondary | ICD-10-CM | POA: Diagnosis not present

## 2023-10-19 DIAGNOSIS — E559 Vitamin D deficiency, unspecified: Secondary | ICD-10-CM | POA: Diagnosis not present

## 2023-10-19 DIAGNOSIS — I10 Essential (primary) hypertension: Secondary | ICD-10-CM | POA: Diagnosis not present

## 2023-10-19 DIAGNOSIS — Z0000 Encounter for general adult medical examination without abnormal findings: Secondary | ICD-10-CM | POA: Diagnosis not present

## 2023-10-19 DIAGNOSIS — J302 Other seasonal allergic rhinitis: Secondary | ICD-10-CM | POA: Diagnosis not present

## 2023-11-14 DIAGNOSIS — Z860101 Personal history of adenomatous and serrated colon polyps: Secondary | ICD-10-CM | POA: Diagnosis not present

## 2023-12-14 DIAGNOSIS — K080 Exfoliation of teeth due to systemic causes: Secondary | ICD-10-CM | POA: Diagnosis not present

## 2024-01-01 DIAGNOSIS — Z09 Encounter for follow-up examination after completed treatment for conditions other than malignant neoplasm: Secondary | ICD-10-CM | POA: Diagnosis not present

## 2024-01-01 DIAGNOSIS — K648 Other hemorrhoids: Secondary | ICD-10-CM | POA: Diagnosis not present

## 2024-01-01 DIAGNOSIS — D120 Benign neoplasm of cecum: Secondary | ICD-10-CM | POA: Diagnosis not present

## 2024-01-01 DIAGNOSIS — Z860101 Personal history of adenomatous and serrated colon polyps: Secondary | ICD-10-CM | POA: Diagnosis not present

## 2024-03-26 DIAGNOSIS — I10 Essential (primary) hypertension: Secondary | ICD-10-CM | POA: Diagnosis not present

## 2024-03-30 DIAGNOSIS — I10 Essential (primary) hypertension: Secondary | ICD-10-CM | POA: Diagnosis not present

## 2024-04-16 DIAGNOSIS — I10 Essential (primary) hypertension: Secondary | ICD-10-CM | POA: Diagnosis not present

## 2024-04-24 DIAGNOSIS — I10 Essential (primary) hypertension: Secondary | ICD-10-CM | POA: Diagnosis not present

## 2024-04-29 DIAGNOSIS — I10 Essential (primary) hypertension: Secondary | ICD-10-CM | POA: Diagnosis not present

## 2024-05-24 DIAGNOSIS — I10 Essential (primary) hypertension: Secondary | ICD-10-CM | POA: Diagnosis not present

## 2024-05-30 DIAGNOSIS — I10 Essential (primary) hypertension: Secondary | ICD-10-CM | POA: Diagnosis not present

## 2024-06-23 DIAGNOSIS — I10 Essential (primary) hypertension: Secondary | ICD-10-CM | POA: Diagnosis not present

## 2024-06-30 DIAGNOSIS — I10 Essential (primary) hypertension: Secondary | ICD-10-CM | POA: Diagnosis not present

## 2024-07-04 DIAGNOSIS — K080 Exfoliation of teeth due to systemic causes: Secondary | ICD-10-CM | POA: Diagnosis not present

## 2024-07-23 DIAGNOSIS — I10 Essential (primary) hypertension: Secondary | ICD-10-CM | POA: Diagnosis not present

## 2024-07-30 DIAGNOSIS — I10 Essential (primary) hypertension: Secondary | ICD-10-CM | POA: Diagnosis not present

## 2024-08-22 DIAGNOSIS — I10 Essential (primary) hypertension: Secondary | ICD-10-CM | POA: Diagnosis not present

## 2024-08-30 DIAGNOSIS — L820 Inflamed seborrheic keratosis: Secondary | ICD-10-CM | POA: Diagnosis not present

## 2024-08-30 DIAGNOSIS — I10 Essential (primary) hypertension: Secondary | ICD-10-CM | POA: Diagnosis not present

## 2024-08-30 DIAGNOSIS — Z872 Personal history of diseases of the skin and subcutaneous tissue: Secondary | ICD-10-CM | POA: Diagnosis not present

## 2024-08-30 DIAGNOSIS — L409 Psoriasis, unspecified: Secondary | ICD-10-CM | POA: Diagnosis not present

## 2024-08-30 DIAGNOSIS — L720 Epidermal cyst: Secondary | ICD-10-CM | POA: Diagnosis not present

## 2024-09-04 DIAGNOSIS — H0288B Meibomian gland dysfunction left eye, upper and lower eyelids: Secondary | ICD-10-CM | POA: Diagnosis not present

## 2024-09-04 DIAGNOSIS — H0288A Meibomian gland dysfunction right eye, upper and lower eyelids: Secondary | ICD-10-CM | POA: Diagnosis not present

## 2024-09-04 DIAGNOSIS — H25813 Combined forms of age-related cataract, bilateral: Secondary | ICD-10-CM | POA: Diagnosis not present

## 2024-09-04 DIAGNOSIS — H16223 Keratoconjunctivitis sicca, not specified as Sjogren's, bilateral: Secondary | ICD-10-CM | POA: Diagnosis not present

## 2024-09-04 DIAGNOSIS — H35033 Hypertensive retinopathy, bilateral: Secondary | ICD-10-CM | POA: Diagnosis not present

## 2024-09-04 DIAGNOSIS — I10 Essential (primary) hypertension: Secondary | ICD-10-CM | POA: Diagnosis not present

## 2024-09-04 DIAGNOSIS — H43812 Vitreous degeneration, left eye: Secondary | ICD-10-CM | POA: Diagnosis not present

## 2024-09-04 DIAGNOSIS — L718 Other rosacea: Secondary | ICD-10-CM | POA: Diagnosis not present

## 2024-09-21 DIAGNOSIS — I10 Essential (primary) hypertension: Secondary | ICD-10-CM | POA: Diagnosis not present

## 2024-09-29 DIAGNOSIS — I10 Essential (primary) hypertension: Secondary | ICD-10-CM | POA: Diagnosis not present

## 2024-10-09 DIAGNOSIS — I10 Essential (primary) hypertension: Secondary | ICD-10-CM | POA: Diagnosis not present

## 2024-10-09 DIAGNOSIS — R1013 Epigastric pain: Secondary | ICD-10-CM | POA: Diagnosis not present

## 2024-10-09 DIAGNOSIS — G629 Polyneuropathy, unspecified: Secondary | ICD-10-CM | POA: Diagnosis not present

## 2024-10-09 DIAGNOSIS — D696 Thrombocytopenia, unspecified: Secondary | ICD-10-CM | POA: Diagnosis not present

## 2024-10-22 ENCOUNTER — Encounter: Payer: Self-pay | Admitting: Neurology

## 2025-01-28 ENCOUNTER — Ambulatory Visit: Payer: Self-pay | Admitting: Neurology
